# Patient Record
Sex: Male | Born: 1955 | Race: White | Hispanic: No | Marital: Single | Smoking: Never smoker
Health system: Southern US, Community
[De-identification: ages and names within clinical notes are randomized; demographics above are authoritative.]

## PROBLEM LIST (undated history)

## (undated) HISTORY — PX: BACK SURGERY: SHX140

## (undated) HISTORY — PX: LEG SURGERY: SHX1003

---

## 2001-02-25 ENCOUNTER — Emergency Department (HOSPITAL_COMMUNITY): Admission: EM | Admit: 2001-02-25 | Discharge: 2001-02-25 | Payer: Self-pay | Admitting: Emergency Medicine

## 2001-02-25 ENCOUNTER — Encounter: Payer: Self-pay | Admitting: Emergency Medicine

## 2001-07-06 ENCOUNTER — Emergency Department (HOSPITAL_COMMUNITY): Admission: EM | Admit: 2001-07-06 | Discharge: 2001-07-07 | Payer: Self-pay | Admitting: Emergency Medicine

## 2001-07-31 ENCOUNTER — Emergency Department (HOSPITAL_COMMUNITY): Admission: EM | Admit: 2001-07-31 | Discharge: 2001-07-31 | Payer: Self-pay

## 2001-08-22 ENCOUNTER — Emergency Department (HOSPITAL_COMMUNITY): Admission: EM | Admit: 2001-08-22 | Discharge: 2001-08-22 | Payer: Self-pay | Admitting: Emergency Medicine

## 2001-08-23 ENCOUNTER — Emergency Department (HOSPITAL_COMMUNITY): Admission: EM | Admit: 2001-08-23 | Discharge: 2001-08-23 | Payer: Self-pay | Admitting: Emergency Medicine

## 2002-07-08 ENCOUNTER — Emergency Department (HOSPITAL_COMMUNITY): Admission: EM | Admit: 2002-07-08 | Discharge: 2002-07-08 | Payer: Self-pay | Admitting: *Deleted

## 2002-11-21 ENCOUNTER — Encounter: Payer: Self-pay | Admitting: Emergency Medicine

## 2002-11-21 ENCOUNTER — Emergency Department (HOSPITAL_COMMUNITY): Admission: EM | Admit: 2002-11-21 | Discharge: 2002-11-21 | Payer: Self-pay | Admitting: Emergency Medicine

## 2003-01-08 ENCOUNTER — Emergency Department (HOSPITAL_COMMUNITY): Admission: EM | Admit: 2003-01-08 | Discharge: 2003-01-08 | Payer: Self-pay | Admitting: Emergency Medicine

## 2003-01-18 ENCOUNTER — Ambulatory Visit (HOSPITAL_BASED_OUTPATIENT_CLINIC_OR_DEPARTMENT_OTHER): Admission: RE | Admit: 2003-01-18 | Discharge: 2003-01-18 | Payer: Self-pay | Admitting: Orthopedic Surgery

## 2003-02-08 ENCOUNTER — Emergency Department (HOSPITAL_COMMUNITY): Admission: EM | Admit: 2003-02-08 | Discharge: 2003-02-09 | Payer: Self-pay | Admitting: Emergency Medicine

## 2003-02-09 ENCOUNTER — Encounter: Payer: Self-pay | Admitting: Emergency Medicine

## 2003-02-09 ENCOUNTER — Ambulatory Visit (HOSPITAL_COMMUNITY): Admission: RE | Admit: 2003-02-09 | Discharge: 2003-02-09 | Payer: Self-pay | Admitting: Orthopedic Surgery

## 2003-02-10 ENCOUNTER — Ambulatory Visit (HOSPITAL_COMMUNITY): Admission: RE | Admit: 2003-02-10 | Discharge: 2003-02-15 | Payer: Self-pay | Admitting: Orthopedic Surgery

## 2003-05-04 ENCOUNTER — Emergency Department (HOSPITAL_COMMUNITY): Admission: EM | Admit: 2003-05-04 | Discharge: 2003-05-04 | Payer: Self-pay | Admitting: Emergency Medicine

## 2003-05-05 ENCOUNTER — Emergency Department (HOSPITAL_COMMUNITY): Admission: EM | Admit: 2003-05-05 | Discharge: 2003-05-05 | Payer: Self-pay | Admitting: Emergency Medicine

## 2003-05-06 ENCOUNTER — Emergency Department (HOSPITAL_COMMUNITY): Admission: EM | Admit: 2003-05-06 | Discharge: 2003-05-06 | Payer: Self-pay | Admitting: Emergency Medicine

## 2003-05-07 ENCOUNTER — Emergency Department (HOSPITAL_COMMUNITY): Admission: EM | Admit: 2003-05-07 | Discharge: 2003-05-07 | Payer: Self-pay

## 2003-07-11 ENCOUNTER — Emergency Department (HOSPITAL_COMMUNITY): Admission: AD | Admit: 2003-07-11 | Discharge: 2003-07-11 | Payer: Self-pay | Admitting: Internal Medicine

## 2003-07-13 ENCOUNTER — Emergency Department (HOSPITAL_COMMUNITY): Admission: AD | Admit: 2003-07-13 | Discharge: 2003-07-13 | Payer: Self-pay | Admitting: Family Medicine

## 2003-07-16 ENCOUNTER — Emergency Department (HOSPITAL_COMMUNITY): Admission: AD | Admit: 2003-07-16 | Discharge: 2003-07-16 | Payer: Self-pay | Admitting: Family Medicine

## 2003-07-19 ENCOUNTER — Emergency Department (HOSPITAL_COMMUNITY): Admission: AD | Admit: 2003-07-19 | Discharge: 2003-07-19 | Payer: Self-pay | Admitting: Family Medicine

## 2004-05-27 ENCOUNTER — Emergency Department (HOSPITAL_COMMUNITY): Admission: EM | Admit: 2004-05-27 | Discharge: 2004-05-27 | Payer: Self-pay | Admitting: Emergency Medicine

## 2004-05-31 ENCOUNTER — Emergency Department (HOSPITAL_COMMUNITY): Admission: EM | Admit: 2004-05-31 | Discharge: 2004-05-31 | Payer: Self-pay | Admitting: Emergency Medicine

## 2004-06-07 ENCOUNTER — Emergency Department (HOSPITAL_COMMUNITY): Admission: EM | Admit: 2004-06-07 | Discharge: 2004-06-07 | Payer: Self-pay | Admitting: Emergency Medicine

## 2004-06-10 ENCOUNTER — Emergency Department (HOSPITAL_COMMUNITY): Admission: EM | Admit: 2004-06-10 | Discharge: 2004-06-11 | Payer: Self-pay | Admitting: Emergency Medicine

## 2004-06-10 ENCOUNTER — Ambulatory Visit (HOSPITAL_COMMUNITY): Admission: RE | Admit: 2004-06-10 | Discharge: 2004-06-10 | Payer: Self-pay | Admitting: Emergency Medicine

## 2004-06-22 ENCOUNTER — Ambulatory Visit (HOSPITAL_COMMUNITY): Admission: RE | Admit: 2004-06-22 | Discharge: 2004-06-23 | Payer: Self-pay | Admitting: Neurosurgery

## 2004-08-14 ENCOUNTER — Ambulatory Visit (HOSPITAL_COMMUNITY): Admission: RE | Admit: 2004-08-14 | Discharge: 2004-08-14 | Payer: Self-pay | Admitting: Neurosurgery

## 2005-10-04 ENCOUNTER — Emergency Department (HOSPITAL_COMMUNITY): Admission: EM | Admit: 2005-10-04 | Discharge: 2005-10-04 | Payer: Self-pay | Admitting: Family Medicine

## 2005-10-06 ENCOUNTER — Emergency Department (HOSPITAL_COMMUNITY): Admission: EM | Admit: 2005-10-06 | Discharge: 2005-10-06 | Payer: Self-pay | Admitting: Family Medicine

## 2006-01-01 ENCOUNTER — Emergency Department (HOSPITAL_COMMUNITY): Admission: EM | Admit: 2006-01-01 | Discharge: 2006-01-01 | Payer: Self-pay | Admitting: Family Medicine

## 2006-01-03 ENCOUNTER — Emergency Department (HOSPITAL_COMMUNITY): Admission: EM | Admit: 2006-01-03 | Discharge: 2006-01-03 | Payer: Self-pay | Admitting: Family Medicine

## 2006-01-09 ENCOUNTER — Emergency Department (HOSPITAL_COMMUNITY): Admission: EM | Admit: 2006-01-09 | Discharge: 2006-01-09 | Payer: Self-pay | Admitting: Family Medicine

## 2006-03-05 ENCOUNTER — Emergency Department (HOSPITAL_COMMUNITY): Admission: EM | Admit: 2006-03-05 | Discharge: 2006-03-05 | Payer: Self-pay | Admitting: Family Medicine

## 2006-03-07 ENCOUNTER — Emergency Department (HOSPITAL_COMMUNITY): Admission: EM | Admit: 2006-03-07 | Discharge: 2006-03-07 | Payer: Self-pay | Admitting: Family Medicine

## 2006-04-10 ENCOUNTER — Emergency Department (HOSPITAL_COMMUNITY): Admission: EM | Admit: 2006-04-10 | Discharge: 2006-04-10 | Payer: Self-pay | Admitting: Family Medicine

## 2006-04-12 ENCOUNTER — Emergency Department (HOSPITAL_COMMUNITY): Admission: EM | Admit: 2006-04-12 | Discharge: 2006-04-12 | Payer: Self-pay | Admitting: Emergency Medicine

## 2006-05-28 ENCOUNTER — Emergency Department (HOSPITAL_COMMUNITY): Admission: EM | Admit: 2006-05-28 | Discharge: 2006-05-28 | Payer: Self-pay | Admitting: Emergency Medicine

## 2006-07-14 ENCOUNTER — Emergency Department (HOSPITAL_COMMUNITY): Admission: EM | Admit: 2006-07-14 | Discharge: 2006-07-14 | Payer: Self-pay | Admitting: Family Medicine

## 2006-07-16 ENCOUNTER — Emergency Department (HOSPITAL_COMMUNITY): Admission: EM | Admit: 2006-07-16 | Discharge: 2006-07-16 | Payer: Self-pay | Admitting: Family Medicine

## 2006-07-18 ENCOUNTER — Emergency Department (HOSPITAL_COMMUNITY): Admission: EM | Admit: 2006-07-18 | Discharge: 2006-07-18 | Payer: Self-pay | Admitting: Family Medicine

## 2006-07-21 ENCOUNTER — Emergency Department (HOSPITAL_COMMUNITY): Admission: EM | Admit: 2006-07-21 | Discharge: 2006-07-21 | Payer: Self-pay | Admitting: Family Medicine

## 2006-11-08 ENCOUNTER — Emergency Department (HOSPITAL_COMMUNITY): Admission: EM | Admit: 2006-11-08 | Discharge: 2006-11-09 | Payer: Self-pay | Admitting: Emergency Medicine

## 2006-12-11 ENCOUNTER — Emergency Department (HOSPITAL_COMMUNITY): Admission: EM | Admit: 2006-12-11 | Discharge: 2006-12-11 | Payer: Self-pay | Admitting: Emergency Medicine

## 2007-02-03 ENCOUNTER — Emergency Department (HOSPITAL_COMMUNITY): Admission: EM | Admit: 2007-02-03 | Discharge: 2007-02-03 | Payer: Self-pay | Admitting: Family Medicine

## 2007-02-05 ENCOUNTER — Emergency Department (HOSPITAL_COMMUNITY): Admission: EM | Admit: 2007-02-05 | Discharge: 2007-02-05 | Payer: Self-pay | Admitting: Family Medicine

## 2007-09-03 ENCOUNTER — Emergency Department (HOSPITAL_COMMUNITY): Admission: EM | Admit: 2007-09-03 | Discharge: 2007-09-03 | Payer: Self-pay | Admitting: Emergency Medicine

## 2008-12-10 ENCOUNTER — Emergency Department (HOSPITAL_COMMUNITY): Admission: EM | Admit: 2008-12-10 | Discharge: 2008-12-10 | Payer: Self-pay | Admitting: Emergency Medicine

## 2009-12-19 ENCOUNTER — Emergency Department (HOSPITAL_COMMUNITY): Admission: EM | Admit: 2009-12-19 | Discharge: 2009-12-19 | Payer: Self-pay | Admitting: Family Medicine

## 2010-09-09 ENCOUNTER — Emergency Department (HOSPITAL_COMMUNITY)
Admission: EM | Admit: 2010-09-09 | Discharge: 2010-09-09 | Payer: Self-pay | Source: Home / Self Care | Admitting: Emergency Medicine

## 2011-02-02 NOTE — Op Note (Signed)
   NAME:  Larry Shaw, Larry Shaw                         ACCOUNT NO.:  [id number]   MEDICAL RECORD NO.:  [id number]                   PATIENT TYPE:  AMB   LOCATION:  DSC                                  FACILITY:  [facility]   PHYSICIAN:  Almedia Balls. Ranell Patrick, M.D.              DATE OF BIRTH:  01/23/1956   DATE OF PROCEDURE:  01/18/2003  DATE OF DISCHARGE:                                 OPERATIVE REPORT   PREOPERATIVE DIAGNOSIS:  Right fifth metatarsal fracture.   POSTOPERATIVE DIAGNOSIS:  Right fifth metatarsal fracture.   OPERATION PERFORMED:  Percutaneous cannulated screw fixation, right fifth  metatarsal (Jones fracture).   SURGEON:  Almedia Balls. Ranell Patrick, M.D.   ASSISTANT:  Druscilla Brownie. Cherlynn June.   ANESTHESIA:  General.   ESTIMATED BLOOD LOSS:  Minimal.   FLUIDS REPLACED:  crystalloid.   INSTRUMENT COUNT:  Correct.   COMPLICATIONS:  None.   ANTIBIOTICS:  Preoperative antibiotics given.   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  The patient has had a series of radiographs taken at [facility] demonstrating progressive stress fracture developing in the Jones  region of the fifth metatarsal base.  The patient presents with disabling  pain and swelling and desiring operative fixation.  The risks and benefits  of both surgical and nonsurgical treatment were discussed.  The patient  elected to proceed with surgery.   DESCRIPTION OF PROCEDURE:  After an adequate level of anesthesia was  achieved, the patient was positioned supine on the operating table.  A bump  was placed under the right hip after sterile prep and drape and utilizing C-  arm fluoroscopy, a single percutaneous 4.5 cannulated screw was placed  proximal to distal across the patient's fracture sites.  This gained  excellent purchase in the distal fragment and the fracture site was  compressed  noticeably.  A single washer was utilized as well in order to  gain good compression of the screw head.  At this point the wound  was thoroughly irrigated and closed utilizing nylon suture.  Sterile  dressing was placed followed by a short leg splint.  The patient was taken  to the recovery room in stable condition.  He will be non weightbearing for  the next four weeks.                                                Almedia Balls. Ranell Patrick, M.D.    SRN/MEDQ  D:  01/18/2003  T:  01/18/2003  Job:  [id number]

## 2011-02-02 NOTE — Op Note (Signed)
Larry Shaw, Larry Shaw               ACCOUNT NO.:  [id number]   MEDICAL RECORD NO.:  [id number]          PATIENT TYPE:  OIB   LOCATION:  2876                         FACILITY:  [facility]   PHYSICIAN:  Clydene Fake, M.D.  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   PROCEDURE:  Right L4-5 semihemilaminectomy and diskectomy with  microdissection with microscope.   SURGEON:  Clydene Fake, M.D.   ASSISTANT:  Danae Orleans. Venetia Maxon, M.D.   ANESTHESIA:  General endotracheal tube anesthesia.   ESTIMATED BLOOD LOSS:  Minimal.   BLOOD REPLACED:  None.   DRAINS:  None.   COMPLICATIONS:  None.   INDICATIONS FOR PROCEDURE:  The patient is a 55 year old gentleman who is  complaining of pain, and had an acute episode of pain in the right leg with  numbness which has been continuing.  MRI was done showing extremely large  disk herniation with free fragment compressing nerve roots on the right and  causing canal stenosis and the examination shows right DHL's 4 to 5 strength  and dorsiflexion 4+/5 strength.  Positive straight leg raising.  He cannot  ambulate on heels on the right side with decreased sensation in the right 4  and 5 distributions.  The patient is brought in for decompression.   DESCRIPTION OF PROCEDURE:  The patient was brought into the operating room  and general anesthesia was induced.  The patient was placed in a prone  position on a Wilson frame with all pressure points padded.  The patient was  prepped and draped in the usual sterile fashion.  The site of incision was  injected with 10 mL of 1% lidocaine with epinephrine.  Needle was placed in  the interspace.  X-ray was obtained confirming our position at the L4-5  level.  Incision was then made centered where the needle was. The incision  was taken down to the fascia and hemostasis was obtained with Bovie  cauterization.  The fascia was incised on the right side of the spine.  Subperiosteal dissection was done over the L4 and L5 spinous processes up to  the lamina and to facet.  Self-retaining retractor was placed and marker was  placed in the interspace.  Another x-ray was obtained confirming our  position at L4-5.  Semihemilaminectomy was then performed with high speed  drill, pituitary rongeurs, the top of the L5 lamina was removed along with a  __________  over the 5 root.  Microscope was brought in for microdissection.  We dissected down on the epidural space removing more ligament in that disk  space.  Nerve root was tight and pushed laterally.  We could dissect under  it a little bit and found free fragment of disk.  We were able to remove a  few pieces, but could not get to the big fragment.  At this point  we incised  the disk space with a 15 blade and diskectomy was performed.  Pituitary  rongeurs and curets, we pushed some free fragment back into the disk space  and removed some and then we were able to get a piece of towel free fragment  of disk and slowing removing that, we were able to remove a huge fragment  that was out of the disk space up under the dura and nerve roots.  After  this, we were able to retract the nerve root and dura medially and we  continued decompressing and removed some more smaller fragments of disk and  continuing with the diskectomy.  Diskectomy was completed with thick curets  and pituitary rongeurs.  Osteophytic edges were removed with osteophyte  tool.  Hemostasis was obtained with thrombin and bipolar cauterization.  Gelfoam was irrigated out.  When we were finished, we had good central and  lateral decompression of both the 4 and 5 roots were decompressed.  We had  good hemostasis.  The wound was irrigated  with antibiotic solution.  This  was suctioned out and the retractor was removed and the fascia closed with 0  Vicryl interrupted suture, the subcutaneous tissue closed with 0, 2-0, and 3-  0 Vicryl interrupted suture, and the skin closed with Benzoin and Steri-  Strips.  Dressing was placed.  The patient was placed back in the supine  position, awakened from anesthesia, and transferred to the recovery room in  stable condition.       JRH/MEDQ  D:  06/22/2004  T:  06/22/2004  Job:  [id number]

## 2011-02-02 NOTE — Op Note (Signed)
NAME:  Larry Shaw, Larry Shaw                         ACCOUNT NO.:  [id number]   MEDICAL RECORD NO.:  [id number]                   PATIENT TYPE:  OIB   LOCATION:  2888                                 FACILITY:  [facility]   PHYSICIAN:  Almedia Balls. Ranell Patrick, M.D.              DATE OF BIRTH:  Aug 27, 1956   DATE OF PROCEDURE:  02/10/2003  DATE OF DISCHARGE:                                 OPERATIVE REPORT   PREOPERATIVE DIAGNOSIS:  Right foot infection.   POSTOPERATIVE DIAGNOSIS:  Right foot infection.   OPERATION PERFORMED:  Hardware removal and irrigation and debridement of  right foot infection, fifth metatarsal fracture.   SURGEON:  Almedia Balls. Ranell Patrick, M.D.   ASSISTANT:  None.   ANESTHESIA:  Laryngeal mask.   ESTIMATED BLOOD LOSS:  Minimal.   FLUIDS REPLACED:  crystalloid.   INSTRUMENT COUNT:  Correct.   COMPLICATIONS:  None.   CULTURES:  Preoperative cultures were sent.   INDICATIONS FOR PROCEDURE:  The patient is a 55 year old male who is about a  month status post open reduction internal fixation of a stress fracture of  his right fifth metatarsal.  The patient had a cannulated screw fixation  performed at [facility] back on Jan 18, 2003.  The patient  initially did well after surgery saying his pain was improved.  He has been  minimal weightbearing on the foot.  Presented over the last two days  complaining of increasing pain and swelling in the foot.  Radiographs  demonstrate some loosening of the screw head indicative of an localized  infection __________ fifth metatarsal with the screw.  Due to concerns about  the swelling and infection in his foot, it was recommended to him to proceed  with hardware removal and I&D. The patient consented to surgery.   DESCRIPTION OF PROCEDURE:  After an adequate level of anesthesia was  achieved, the patient was positioned supine on the operating table.  The  patient's prior stab incision was opened up utilizing a 15  blade scalpel.  Dissection was carried bluntly down through subcutaneous tissues using  hemostats.  No gross purulence was noted. The subcutaneous tissues were  cultured utilizing culture swabs for aerobic, anaerobic, stat Gram stain.  Once the hardware was out, deep cultures were obtained including a little  bit of tissue.  This again was sent for aerobic, anaerobic, Gram stain.  With both the 4.5 cannulated screw out and the washer out, a thorough  debridement was performed of the base of the fifth metatarsal including  curetting the entrance hole for the screw also overdrilling the path of the  screw with the 4.5 drill bit off the 4.5 cannulated drill set and irrigation  of the intramedullary canal was performed utilizing a Frazier tip suction  and blasting antibiotic and normal saline irrigation through that.  Again,  this appeared to be a fairly benign wound with no  evidence of gross  purulence and no soft bone encountered with a curet.  The wound again was  irrigated on the way out and the wound loosely sutured utilizing a single 3-  0 nylon suture so that it could drain.  A sterile dressing followed by a  short leg splint was applied.  The patient tolerated the procedure well and  was taken to the recovery room in stable condition.                                                Almedia Balls. Ranell Patrick, M.D.    SRN/MEDQ  D:  02/10/2003  T:  02/10/2003  Job:  [id number]

## 2011-06-22 LAB — URINALYSIS, ROUTINE W REFLEX MICROSCOPIC
Bilirubin Urine: NEGATIVE
Glucose, UA: NEGATIVE
Hgb urine dipstick: NEGATIVE
Ketones, ur: NEGATIVE
Nitrite: NEGATIVE
Protein, ur: NEGATIVE
Specific Gravity, Urine: 1.022
Urobilinogen, UA: 0.2
pH: 6

## 2012-02-11 ENCOUNTER — Emergency Department (INDEPENDENT_AMBULATORY_CARE_PROVIDER_SITE_OTHER)
Admission: EM | Admit: 2012-02-11 | Discharge: 2012-02-11 | Disposition: A | Discharge status: Nursing Home (Includes ICF) | Payer: Self-pay | Source: Home / Self Care | Attending: Emergency Medicine | Admitting: Emergency Medicine

## 2012-02-11 ENCOUNTER — Emergency Department (HOSPITAL_COMMUNITY): Payer: Self-pay

## 2012-02-11 ENCOUNTER — Encounter (HOSPITAL_COMMUNITY): Payer: Self-pay | Admitting: *Deleted

## 2012-02-11 ENCOUNTER — Observation Stay (HOSPITAL_COMMUNITY): Payer: Self-pay

## 2012-02-11 ENCOUNTER — Observation Stay (HOSPITAL_COMMUNITY)
Admission: EM | Admit: 2012-02-11 | Discharge: 2012-02-11 | Disposition: A | Discharge status: Home / Self Care | Payer: Self-pay | Attending: Emergency Medicine | Admitting: Emergency Medicine

## 2012-02-11 ENCOUNTER — Encounter (HOSPITAL_COMMUNITY): Payer: Self-pay | Admitting: Emergency Medicine

## 2012-02-11 DIAGNOSIS — E860 Dehydration: Secondary | ICD-10-CM

## 2012-02-11 DIAGNOSIS — R0789 Other chest pain: Secondary | ICD-10-CM

## 2012-02-11 DIAGNOSIS — R319 Hematuria, unspecified: Secondary | ICD-10-CM | POA: Insufficient documentation

## 2012-02-11 DIAGNOSIS — R109 Unspecified abdominal pain: Principal | ICD-10-CM | POA: Insufficient documentation

## 2012-02-11 DIAGNOSIS — R079 Chest pain, unspecified: Secondary | ICD-10-CM

## 2012-02-11 DIAGNOSIS — K5900 Constipation, unspecified: Secondary | ICD-10-CM | POA: Insufficient documentation

## 2012-02-11 LAB — BASIC METABOLIC PANEL
BUN: 19 mg/dL (ref 6–23)
CO2: 25 mEq/L (ref 19–32)
Calcium: 9.9 mg/dL (ref 8.4–10.5)
Chloride: 101 mEq/L (ref 96–112)
Creatinine, Ser: 1.29 mg/dL (ref 0.50–1.35)
GFR calc Af Amer: 71 mL/min — ABNORMAL LOW (ref >90)
GFR calc non Af Amer: 61 mL/min — ABNORMAL LOW (ref >90)
Glucose, Bld: 97 mg/dL (ref 70–99)
Potassium: 3.8 mEq/L (ref 3.5–5.1)
Sodium: 137 mEq/L (ref 135–145)

## 2012-02-11 LAB — HEPATIC FUNCTION PANEL
ALT: 18 U/L (ref 0–53)
AST: 17 U/L (ref 0–37)
Albumin: 4 g/dL (ref 3.5–5.2)
Alkaline Phosphatase: 89 U/L (ref 39–117)
Bilirubin, Direct: <0.1 mg/dL (ref 0.0–0.3)
Total Bilirubin: 0.3 mg/dL (ref 0.3–1.2)
Total Protein: 7.3 g/dL (ref 6.0–8.3)

## 2012-02-11 LAB — POCT I-STAT TROPONIN I: Troponin i, poc: 0 ng/mL (ref 0.00–0.08)

## 2012-02-11 LAB — DIFFERENTIAL
Basophils Absolute: 0 10*3/uL (ref 0.0–0.1)
Basophils Relative: 0 % (ref 0–1)
Eosinophils Absolute: 0.4 10*3/uL (ref 0.0–0.7)
Eosinophils Relative: 5 % (ref 0–5)
Lymphocytes Relative: 24 % (ref 12–46)
Lymphs Abs: 2.1 10*3/uL (ref 0.7–4.0)
Monocytes Absolute: 0.8 10*3/uL (ref 0.1–1.0)
Monocytes Relative: 10 % (ref 3–12)
Neutro Abs: 5.2 10*3/uL (ref 1.7–7.7)
Neutrophils Relative %: 61 % (ref 43–77)

## 2012-02-11 LAB — CBC
HCT: 46.1 % (ref 39.0–52.0)
Hemoglobin: 15.9 g/dL (ref 13.0–17.0)
MCH: 30.2 pg (ref 26.0–34.0)
MCHC: 34.5 g/dL (ref 30.0–36.0)
MCV: 87.5 fL (ref 78.0–100.0)
Platelets: 331 10*3/uL (ref 150–400)
RBC: 5.27 MIL/uL (ref 4.22–5.81)
RDW: 13.1 % (ref 11.5–15.5)
WBC: 8.6 10*3/uL (ref 4.0–10.5)

## 2012-02-11 LAB — URINALYSIS, ROUTINE W REFLEX MICROSCOPIC
Glucose, UA: NEGATIVE mg/dL
Hgb urine dipstick: NEGATIVE
Ketones, ur: 15 mg/dL — AB
Leukocytes, UA: NEGATIVE
Nitrite: NEGATIVE
Protein, ur: 100 mg/dL — AB
Specific Gravity, Urine: 1.036 — ABNORMAL HIGH (ref 1.005–1.030)
Urobilinogen, UA: 1 mg/dL (ref 0.0–1.0)
pH: 5.5 (ref 5.0–8.0)

## 2012-02-11 LAB — URINE MICROSCOPIC-ADD ON

## 2012-02-11 MED ORDER — KETOROLAC TROMETHAMINE 30 MG/ML IJ SOLN
INTRAMUSCULAR | Status: AC
  Filled 2012-02-11: qty 1

## 2012-02-11 MED ORDER — SODIUM CHLORIDE 0.9 % IV BOLUS (SEPSIS)
1000.0000 mL | Freq: Once | INTRAVENOUS | Status: AC
  Administered 2012-02-11: 1000 mL via INTRAVENOUS

## 2012-02-11 MED ORDER — KETOROLAC TROMETHAMINE 60 MG/2ML IM SOLN
60.0000 mg | Freq: Once | INTRAMUSCULAR | Status: AC
  Administered 2012-02-11: 60 mg via INTRAMUSCULAR

## 2012-02-11 MED ORDER — IBUPROFEN 800 MG PO TABS: 800.0000 mg | ORAL_TABLET | Freq: Three times a day (TID) | ORAL | Status: AC

## 2012-02-11 NOTE — ED Provider Notes (Signed)
Received patient care at shift change. 56 year old male presents with right flank pain radiates to right hemithorax. Has been seen and evaluated. Plan is to rehydrate [place] in the CDU, we'll check repeat troponin and will obtain a d-dimer.  5:14 PM Pt sts he feels better after treatment.  No longer tachycardic.  Repeat troponin and d-dimer unremarkable.  Pain likely musculoskeletal.  Stable to be discharge.  I encourage hydration at home.  Pt tolerates PO.  VSS.    Results for orders placed during the hospital encounter of 02/11/12  CBC      Component Value Range   WBC 8.6  4.0 - 10.5 (K/uL)   RBC 5.27  4.22 - 5.81 (MIL/uL)   Hemoglobin 15.9  13.0 - 17.0 (g/dL)   HCT 16.1  09.6 - 04.5 (%)   MCV 87.5  78.0 - 100.0 (fL)   MCH 30.2  26.0 - 34.0 (pg)   MCHC 34.5  30.0 - 36.0 (g/dL)   RDW 40.9  81.1 - 91.4 (%)   Platelets 331  150 - 400 (K/uL)  DIFFERENTIAL      Component Value Range   Neutrophils Relative 61  43 - 77 (%)   Neutro Abs 5.2  1.7 - 7.7 (K/uL)   Lymphocytes Relative 24  12 - 46 (%)   Lymphs Abs 2.1  0.7 - 4.0 (K/uL)   Monocytes Relative 10  3 - 12 (%)   Monocytes Absolute 0.8  0.1 - 1.0 (K/uL)   Eosinophils Relative 5  0 - 5 (%)   Eosinophils Absolute 0.4  0.0 - 0.7 (K/uL)   Basophils Relative 0  0 - 1 (%)   Basophils Absolute 0.0  0.0 - 0.1 (K/uL)  BASIC METABOLIC PANEL      Component Value Range   Sodium 137  135 - 145 (mEq/L)   Potassium 3.8  3.5 - 5.1 (mEq/L)   Chloride 101  96 - 112 (mEq/L)   CO2 25  19 - 32 (mEq/L)   Glucose, Bld 97  70 - 99 (mg/dL)   BUN 19  6 - 23 (mg/dL)   Creatinine, Ser 7.82  0.50 - 1.35 (mg/dL)   Calcium 9.9  8.4 - 95.6 (mg/dL)   GFR calc non Af Amer 61 (*) >90 (mL/min)   GFR calc Af Amer 71 (*) >90 (mL/min)  URINALYSIS, ROUTINE W REFLEX MICROSCOPIC      Component Value Range   Color, Urine AMBER (*) YELLOW    APPearance CLOUDY (*) CLEAR    Specific Gravity, Urine 1.036 (*) 1.005 - 1.030    pH 5.5  5.0 - 8.0    Glucose, UA NEGATIVE   NEGATIVE (mg/dL)   Hgb urine dipstick NEGATIVE  NEGATIVE    Bilirubin Urine SMALL (*) NEGATIVE    Ketones, ur 15 (*) NEGATIVE (mg/dL)   Protein, ur 213 (*) NEGATIVE (mg/dL)   Urobilinogen, UA 1.0  0.0 - 1.0 (mg/dL)   Nitrite NEGATIVE  NEGATIVE    Leukocytes, UA NEGATIVE  NEGATIVE   POCT I-STAT TROPONIN I      Component Value Range   Troponin i, poc 0.00  0.00 - 0.08 (ng/mL)   Comment 3           HEPATIC FUNCTION PANEL      Component Value Range   Total Protein 7.3  6.0 - 8.3 (g/dL)   Albumin 4.0  3.5 - 5.2 (g/dL)   AST 17  0 - 37 (U/L)   ALT 18  0 -  53 (U/L)   Alkaline Phosphatase 89  39 - 117 (U/L)   Total Bilirubin 0.3  0.3 - 1.2 (mg/dL)   Bilirubin, Direct <1.6  0.0 - 0.3 (mg/dL)   Indirect Bilirubin NOT CALCULATED  0.3 - 0.9 (mg/dL)  URINE MICROSCOPIC-ADD ON      Component Value Range   Squamous Epithelial / LPF RARE  RARE    WBC, UA 0-2  <3 (WBC/hpf)   RBC / HPF 0-2  <3 (RBC/hpf)   Bacteria, UA RARE  RARE    Urine-Other MUCOUS PRESENT    D-DIMER, QUANTITATIVE      Component Value Range   D-Dimer, Quant <0.22  0.00 - 0.48 (ug/mL-FEU)  TROPONIN I      Component Value Range   Troponin I <0.30  <0.30 (ng/mL)   Ct Abdomen Pelvis Wo Contrast  02/11/2012  *RADIOLOGY REPORT*  Clinical Data: Left flank pain, hematuria  CT ABDOMEN AND PELVIS WITHOUT CONTRAST  Technique:  Multidetector CT imaging of the abdomen and pelvis was performed following the standard protocol without intravenous contrast.  Comparison: None.  Findings: Lung bases are essentially clear.  Unenhanced spleen, pancreas, and adrenal glands are within normal limits.  Gallbladder is unremarkable.  No intrahepatic or extrahepatic ductal dilatation.  Kidneys are within normal limits.  No renal calculi or hydronephrosis.  No evidence of bowel obstruction.  Normal appendix.  No colonic wall thickening or inflammatory changes.  Atherosclerotic calcifications of the abdominal aorta and branch vessels.  No abdominopelvic  ascites.  No suspicious abdominopelvic lymphadenopathy.  Dystrophic calcifications in the prostate.  No ureteral or bladder calculi.  Degenerative changes of the visualized thoracolumbar spine, most prominent at L4-5.  IMPRESSION: No renal, ureteral, or bladder calculi.  No hydronephrosis.  No CT findings to account for the patient's left flank pain.  Original Report Authenticated By: Charline Bills, M.D.   Dg Chest 2 View  02/11/2012  *RADIOLOGY REPORT*  Clinical Data: Right lateral chest pain  CHEST - 2 VIEW  Comparison: 02/11/2012 at 0941 hours  Findings: Lungs are clear. No pleural effusion or pneumothorax.  Cardiomediastinal silhouette is within normal limits.  Mild degenerative changes of the visualized thoracolumbar spine.  IMPRESSION: No evidence of acute cardiopulmonary disease.  Original Report Authenticated By: Charline Bills, M.D.   Dg Chest 2 View  02/11/2012  *RADIOLOGY REPORT*  Clinical Data: Severe right chest pain since this morning.  CHEST - 2 VIEW  Comparison: None.  Findings: Heart size and vascularity are normal and the lungs are clear.  No osseous abnormality.  IMPRESSION: Normal chest.  Original Report Authenticated By: Gwynn Burly, M.D.      Fayrene Helper, PA-C 02/11/12 [phone]

## 2012-02-11 NOTE — ED Provider Notes (Signed)
Chief Complaint  Patient presents with  . Chest Pain    History of Present Illness:   Larry Shaw is a 56 year old male who experienced sudden onset of severe right hemithorax pain today while at work. He was just walking slowly when the pain came on. The pain seems to be centered around the right axilla but radiates widely to the right upper quadrant of the abdomen, the entire right hemithorax, right upper back, right shoulder, down the right arm and into the hand, and into the neck. The pain is worse with movement and also with deep inspiration. His arm feels numb and tingling but there is no weakness. He denies any fever, chills, or diaphoresis. He's had no shortness of breath or wheezing. He does have a cough productive of some yellow sputum, although this is an ongoing issue. The pain is worse with movement of the neck and the shoulder. He denies any palpitations, dizziness, or syncope. He denies any nausea or vomiting. The pain does involve the right upper quadrant of the abdomen. He has no history of heart disease, pulmonary disease, or GI problems in the past.  Review of Systems:  Other than noted above, the patient denies any of the following symptoms. Systemic:  No fever, chills, sweats, or fatigue. ENT:  No nasal congestion, rhinorrhea, or sore throat. Pulmonary:  No cough, wheezing, shortness of breath, sputum production, hemoptysis. Cardiac:  No palpitations, rapid heartbeat, dizziness, presyncope or syncope. GI:  No abdominal pain, heartburn, nausea, or vomiting. Skin:  No rash or itching. Ext:  No leg pain or swelling.   PMFSH:  Past medical history, family history, social history, meds, and allergies were reviewed and updated as needed.  Physical Exam:   Vital signs:  BP 128/92  Pulse 104  Temp(Src) 98.4 F (36.9 C) (Oral)  Resp 24  SpO2 96% Gen:  Alert, oriented, in no distress, skin warm and dry. Eye:  PERRL, lids and conjunctivas normal.  Sclera non-icteric. ENT:  Mucous  membranes moist, pharynx clear. Neck:  Supple, no adenopathy or tenderness.  No JVD. Lungs:  Clear to auscultation, no wheezes, rales or rhonchi.  No respiratory distress. Heart:  Regular rhythm.  No gallops, murmers, clicks or rubs. Chest:  He has diffuse chest wall tenderness, this seems to be mostly centered in the upper back area on the right just medial to the shoulder blade. Abdomen:  Soft, flat, and nondistended. There was tenderness to palpation in the right upper quadrant and a positive Murphy's sign but negative Murphy's punch.  Bowel sounds normal.  No pulsatile abdominal mass or bruit. Ext:  No edema.  No calf tenderness and Homann's sign negative.  Pulses full and equal. Skin:  Warm and dry.  No rash.  Radiology:  Dg Chest 2 View  02/11/2012  *RADIOLOGY REPORT*  Clinical Data: Severe right chest pain since this morning.  CHEST - 2 VIEW  Comparison: None.  Findings: Heart size and vascularity are normal and the lungs are clear.  No osseous abnormality.  IMPRESSION: Normal chest.  Original Report Authenticated By: Gwynn Burly, M.D.   EKG:   Date: 02/11/2012  Rate: 90  Rhythm: normal sinus rhythm  QRS Axis: normal  Intervals: normal  ST/T Wave abnormalities: normal  Conduction Disutrbances:none  Narrative Interpretation: Normal sinus rhythm, moderate voltage criteria for LVH, maybe normal variant, borderline EKG.  Old EKG Reviewed: none available  Medications given in [facility]:  He was given Toradol 60 mg IM and tolerated this well without any  immediate side effects. His pain level went from a 10 down to an 8.  Assessment:  The encounter diagnosis was Chest pain. The differential is extensive including musculoskeletal pain, cervical radiculopathy, pulmonary embolus, or gallbladder disease. I think he warrants further evaluation, so we'll send to the emergency department for further testing.  Plan:   1.  The following meds were prescribed:   New Prescriptions   No medications  on file   2.  The patient was sent to the emergency department via shuttle.  Reuben Likes, MD 02/11/12 1034

## 2012-02-11 NOTE — Discharge Instructions (Signed)
We have determined that your problem requires further evaluation in the emergency department.  We will take care of your transport there.  Once at the emergency department, you will be evaluated by a provider and they will order whatever treatment or tests they deem necessary.  We cannot guarantee that they will do any specific test or do any specific treatment.    Chest Pain (Nonspecific) It is often hard to give a specific diagnosis for the cause of chest pain. There is always a chance that your pain could be related to something serious, such as a heart attack or a blood clot in the lungs. You need to follow up with your caregiver for further evaluation. CAUSES   Heartburn.   Pneumonia or bronchitis.   Anxiety or stress.   Inflammation around your heart (pericarditis) or lung (pleuritis or pleurisy).   A blood clot in the lung.   A collapsed lung (pneumothorax). It can develop suddenly on its own (spontaneous pneumothorax) or from injury (trauma) to the chest.   Shingles infection (herpes zoster virus).  The chest wall is composed of bones, muscles, and cartilage. Any of these can be the source of the pain.  The bones can be bruised by injury.   The muscles or cartilage can be strained by coughing or overwork.   The cartilage can be affected by inflammation and become sore (costochondritis).  DIAGNOSIS  Lab tests or other studies, such as X-rays, electrocardiography, stress testing, or cardiac imaging, may be needed to find the cause of your pain.  TREATMENT   Treatment depends on what may be causing your chest pain. Treatment may include:   Acid blockers for heartburn.   Anti-inflammatory medicine.   Pain medicine for inflammatory conditions.   Antibiotics if an infection is present.   You may be advised to change lifestyle habits. This includes stopping smoking and avoiding alcohol, caffeine, and chocolate.   You may be advised to keep your head raised (elevated) when  sleeping. This reduces the chance of acid going backward from your stomach into your esophagus.   Most of the time, nonspecific chest pain will improve within 2 to 3 days with rest and mild pain medicine.  HOME CARE INSTRUCTIONS   If antibiotics were prescribed, take your antibiotics as directed. Finish them even if you start to feel better.   For the next few days, avoid physical activities that bring on chest pain. Continue physical activities as directed.   Do not smoke.   Avoid drinking alcohol.   Only take over-the-counter or prescription medicine for pain, discomfort, or fever as directed by your caregiver.   Follow your caregiver's suggestions for further testing if your chest pain does not go away.   Keep any follow-up appointments you made. If you do not go to an appointment, you could develop lasting (chronic) problems with pain. If there is any problem keeping an appointment, you must call to reschedule.  SEEK MEDICAL CARE IF:   You think you are having problems from the medicine you are taking. Read your medicine instructions carefully.   Your chest pain does not go away, even after treatment.   You develop a rash with blisters on your chest.  SEEK IMMEDIATE MEDICAL CARE IF:   You have increased chest pain or pain that spreads to your arm, neck, jaw, back, or abdomen.   You develop shortness of breath, an increasing cough, or you are coughing up blood.   You have severe back or abdominal   pain, feel nauseous, or vomit.   You develop severe weakness, fainting, or chills.   You have a fever.  THIS IS AN EMERGENCY. Do not wait to see if the pain will go away. Get medical help at once. Call your local emergency services (911 in [place] Do not drive yourself to the hospital. MAKE SURE YOU:   Understand these instructions.   Will watch your condition.   Will get help right away if you are not doing well or get worse.  Document Released: 06/13/2005 Document Revised:  08/23/2011 Document Reviewed: 04/08/2008 [employer] Patient Information 2012 [employer]. 

## 2012-02-11 NOTE — Discharge Instructions (Signed)
Chest Wall Pain Chest wall pain is pain in or around the bones and muscles of your chest. It may take up to 6 weeks to get better. It may take longer if you must stay physically active in your work and activities.  CAUSES  Chest wall pain may happen on its own. However, it may be caused by:  A viral illness like the flu.   Injury.   Coughing.   Exercise.   Arthritis.   Fibromyalgia.   Shingles.  HOME CARE INSTRUCTIONS   Avoid overtiring physical activity. Try not to strain or perform activities that cause pain. This includes any activities using your chest or your abdominal and side muscles, especially if heavy weights are used.   Put ice on the sore area.   Put ice in a plastic bag.   Place a towel between your skin and the bag.   Leave the ice on for 15 to 20 minutes per hour while awake for the first 2 days.   Only take over-the-counter or prescription medicines for pain, discomfort, or fever as directed by your caregiver.  SEEK IMMEDIATE MEDICAL CARE IF:   Your pain increases, or you are very uncomfortable.   You have a fever.   Your chest pain becomes worse.   You have new, unexplained symptoms.   You have nausea or vomiting.   You feel sweaty or lightheaded.   You have a cough with phlegm (sputum), or you cough up blood.  MAKE SURE YOU:   Understand these instructions.   Will watch your condition.   Will get help right away if you are not doing well or get worse.  Document Released: 09/03/2005 Document Revised: 08/23/2011 Document Reviewed: 04/30/2011 [facility] Patient Information 2012 [place], [place].  Dehydration, Adult Dehydration means your body does not have as much fluid as it needs. Your kidneys, brain, and heart will not work properly without the right amount of fluids and salt.  HOME CARE  Ask your doctor how to replace body fluid losses (rehydrate).   Drink enough fluids to keep your pee (urine) clear or pale yellow.   Drink small amounts  of fluids often if you feel sick to your stomach (nauseous) or throw up (vomit).   Eat like you normally do.   Avoid:   Foods or drinks high in sugar.   Bubbly (carbonated) drinks.   Juice.   Very hot or cold fluids.   Drinks with caffeine.   Fatty, greasy foods.   Alcohol.   Tobacco.   Eating too much.   Gelatin desserts.   Wash your hands to avoid spreading germs (bacteria, viruses).   Only take medicine as told by your doctor.   Keep all doctor visits as told.  GET HELP RIGHT AWAY IF:   You cannot drink something without throwing up.   You get worse even with treatment.   Your vomit has blood in it or looks greenish.   Your poop (stool) has blood in it or looks black and tarry.   You have not peed in 6 to 8 hours.   You pee a small amount of very dark pee.   You have a fever.   You pass out (faint).   You have belly (abdominal) pain that gets worse or stays in one spot (localizes).   You have a rash, stiff neck, or bad headache.   You get easily annoyed, sleepy, or are hard to wake up.   You feel weak, dizzy, or very thirsty.  MAKE SURE YOU:   Understand these instructions.   Will watch your condition.   Will get help right away if you are not doing well or get worse.  Document Released: 06/30/2009 Document Revised: 08/23/2011 Document Reviewed: 04/23/2011 [facility] Patient Information 2012 [place], [place].

## 2012-02-11 NOTE — ED Notes (Signed)
Patient was sent down from [facility] states he started having chest pain right lateral onset this am. States he was just walking when the pain started.

## 2012-02-11 NOTE — ED Notes (Signed)
Pt here for sudden onset of right axillary sharp,achy pain radiating to R ARM,FLANK that started @ 7am this morning while at work.c/o pain with any movement and deep breathing.pt has hx asthma but denies wheezing or sob.VSS.ALSO REPORTS OF NUMBNESS IN R ARM

## 2012-02-11 NOTE — ED Provider Notes (Signed)
History     CSN: [id number]  Arrival date & time 02/11/12  1037   First MD Initiated Contact with Patient 02/11/12 1126      Chief Complaint  Patient presents with  . Flank Pain    (Consider location/radiation/quality/duration/timing/severity/associated sxs/prior treatment) Patient is a 56 y.o. male presenting with flank pain. The history is provided by the patient. No language interpreter was used.  Flank Pain This is a new problem. The current episode started today. The problem occurs constantly. The problem has been unchanged. Associated symptoms include chest pain. Pertinent negatives include no abdominal pain, chills, congestion, coughing, diaphoresis, fever, headaches, nausea, neck pain, numbness, vomiting or weakness. The symptoms are aggravated by twisting and bending. He has tried nothing for the symptoms.   56 year old male coming in from urgent care today with complaint of right chest pain that started this morning upon at his job. The pain is worse with palpitation. Patient went to urgent care and received Toradol 60 IM with some relief. Patient denies shortness of breath cough fever calf pain. No past medical history of CAD. The pain is pinpoint it to around the fifth lateral right rib. Dr. Penni Bombard reports that the pain radiated into the chest earlier. The pain is not radiating presently appears to be musculoskeletal. No PCP. Past medical history of asthma. Patient appears dehydrated. Says that he is frequently constipated and he has a tight belly. At that he has to strain to have a bowel movement. No acute distress at this time.    Past Medical History  Diagnosis Date  . Asthma     Past Surgical History  Procedure Date  . Leg surgery     No family history on file.  History  Substance Use Topics  . Smoking status: Never Smoker   . Smokeless tobacco: Not on file  . Alcohol Use: No      Review of Systems  Constitutional: Negative.  Negative for fever, chills and  diaphoresis.  HENT: Negative.  Negative for congestion and neck pain.   Eyes: Negative.   Respiratory: Negative.  Negative for cough and wheezing.   Cardiovascular: Positive for chest pain.  Gastrointestinal: Positive for constipation. Negative for nausea, vomiting, abdominal pain, diarrhea and blood in stool.  Genitourinary: Positive for flank pain. Negative for dysuria, hematuria and testicular pain.  Musculoskeletal: Negative.   Neurological: Negative.  Negative for dizziness, weakness, numbness and headaches.  Psychiatric/Behavioral: Negative.   All other systems reviewed and are negative.    Allergies  Penicillins  Home Medications  No current outpatient prescriptions on file.  BP 114/86  Pulse 92  Temp(Src) 97.4 F (36.3 C) (Oral)  Resp 16  SpO2 98%  Physical Exam  Nursing note and vitals reviewed. Constitutional: He is oriented to person, place, and time. He appears well-developed and well-nourished.  HENT:  Head: Normocephalic.  Eyes: Conjunctivae and EOM are normal. Pupils are equal, round, and reactive to light.  Neck: Normal range of motion. Neck supple.  Cardiovascular: Normal rate.   Pulmonary/Chest: Effort normal and breath sounds normal. No respiratory distress. He has no wheezes.  Abdominal: Soft. Bowel sounds are normal. He exhibits distension. There is no tenderness. There is no rebound and no guarding.       T lateral chest wall pain  Musculoskeletal: Normal range of motion.  Neurological: He is alert and oriented to person, place, and time.  Skin: Skin is warm and dry.  Psychiatric: He has a normal mood and affect.  ED Course  Procedures (including critical care time)  Labs Reviewed  BASIC METABOLIC PANEL - Abnormal; Notable for the following:    GFR calc non Af Amer 61 (*)    GFR calc Af Amer 71 (*)    All other components within normal limits  URINALYSIS, ROUTINE W REFLEX MICROSCOPIC - Abnormal; Notable for the following:    Color, Urine  AMBER (*) BIOCHEMICALS MAY BE AFFECTED BY COLOR   APPearance CLOUDY (*)    Specific Gravity, Urine 1.036 (*)    Bilirubin Urine SMALL (*)    Ketones, ur 15 (*)    Protein, ur 100 (*)    All other components within normal limits  CBC  DIFFERENTIAL  POCT I-STAT TROPONIN I  HEPATIC FUNCTION PANEL  URINE MICROSCOPIC-ADD ON   Ct Abdomen Pelvis Wo Contrast  02/11/2012  *RADIOLOGY REPORT*  Clinical Data: Left flank pain, hematuria  CT ABDOMEN AND PELVIS WITHOUT CONTRAST  Technique:  Multidetector CT imaging of the abdomen and pelvis was performed following the standard protocol without intravenous contrast.  Comparison: None.  Findings: Lung bases are essentially clear.  Unenhanced spleen, pancreas, and adrenal glands are within normal limits.  Gallbladder is unremarkable.  No intrahepatic or extrahepatic ductal dilatation.  Kidneys are within normal limits.  No renal calculi or hydronephrosis.  No evidence of bowel obstruction.  Normal appendix.  No colonic wall thickening or inflammatory changes.  Atherosclerotic calcifications of the abdominal aorta and branch vessels.  No abdominopelvic ascites.  No suspicious abdominopelvic lymphadenopathy.  Dystrophic calcifications in the prostate.  No ureteral or bladder calculi.  Degenerative changes of the visualized thoracolumbar spine, most prominent at L4-5.  IMPRESSION: No renal, ureteral, or bladder calculi.  No hydronephrosis.  No CT findings to account for the patient's left flank pain.  Original Report Authenticated By: Charline Bills, M.D.   Dg Chest 2 View  02/11/2012  *RADIOLOGY REPORT*  Clinical Data: Right lateral chest pain  CHEST - 2 VIEW  Comparison: 02/11/2012 at 0941 hours  Findings: Lungs are clear. No pleural effusion or pneumothorax.  Cardiomediastinal silhouette is within normal limits.  Mild degenerative changes of the visualized thoracolumbar spine.  IMPRESSION: No evidence of acute cardiopulmonary disease.  Original Report Authenticated  By: Charline Bills, M.D.   Dg Chest 2 View  02/11/2012  *RADIOLOGY REPORT*  Clinical Data: Severe right chest pain since this morning.  CHEST - 2 VIEW  Comparison: None.  Findings: Heart size and vascularity are normal and the lungs are clear.  No osseous abnormality.  IMPRESSION: Normal chest.  Original Report Authenticated By: Gwynn Burly, M.D.     No diagnosis found.    MDM   Date: 02/11/2012  Rate: 64      Rhythm: normal sinus rhythm  QRS Axis: normal  Intervals: normal  ST/T Wave abnormalities: normal  Conduction Disutrbances:none  Narrative Interpretation: R bundle branch block LVH  Old EKG Reviewed: unchanged  Report given to [facility] for R chest pain with no SOB, Fever, cough, normal chest x-ray.  Pain started today. From urgent care. Trop x 1 negative.  EKG with no changes.  No pcp.  Will repeat troponin and add d-dimer.          Remi Haggard, NP 02/11/12 1614

## 2012-02-11 NOTE — ED Notes (Signed)
Patient told that a urine sample is needed. Will call when he has to urinate.

## 2012-02-11 NOTE — ED Provider Notes (Signed)
Medical screening examination/treatment/procedure(s) were performed by non-physician practitioner and as supervising physician I was immediately available for consultation/collaboration.   Mateus Rewerts, MD 02/11/12 2143 

## 2012-02-16 NOTE — ED Provider Notes (Signed)
Medical screening examination/treatment/procedure(s) were performed by non-physician practitioner and as supervising physician I was immediately available for consultation/collaboration.  Lesbia Ottaway, MD 02/16/12 0928 

## 2012-10-29 ENCOUNTER — Emergency Department (INDEPENDENT_AMBULATORY_CARE_PROVIDER_SITE_OTHER)
Admission: EM | Admit: 2012-10-29 | Discharge: 2012-10-29 | Disposition: A | Discharge status: Home / Self Care | Payer: Self-pay | Source: Home / Self Care | Attending: Family Medicine | Admitting: Family Medicine

## 2012-10-29 ENCOUNTER — Encounter (HOSPITAL_COMMUNITY): Payer: Self-pay

## 2012-10-29 DIAGNOSIS — L270 Generalized skin eruption due to drugs and medicaments taken internally: Secondary | ICD-10-CM

## 2012-10-29 DIAGNOSIS — J309 Allergic rhinitis, unspecified: Secondary | ICD-10-CM

## 2012-10-29 DIAGNOSIS — J45909 Unspecified asthma, uncomplicated: Secondary | ICD-10-CM

## 2012-10-29 DIAGNOSIS — L299 Pruritus, unspecified: Secondary | ICD-10-CM

## 2012-10-29 LAB — CBC WITH DIFFERENTIAL/PLATELET
Basophils Absolute: 0 10*3/uL (ref 0.0–0.1)
Basophils Relative: 0 % (ref 0–1)
Eosinophils Relative: 7 % — ABNORMAL HIGH (ref 0–5)
HCT: 46.1 % (ref 39.0–52.0)
Lymphocytes Relative: 31 % (ref 12–46)
MCHC: 34.7 g/dL (ref 30.0–36.0)
MCV: 88.1 fL (ref 78.0–100.0)
Monocytes Absolute: 0.7 10*3/uL (ref 0.1–1.0)
Monocytes Relative: 8 % (ref 3–12)
RDW: 13 % (ref 11.5–15.5)

## 2012-10-29 LAB — COMPREHENSIVE METABOLIC PANEL
AST: 18 U/L (ref 0–37)
BUN: 13 mg/dL (ref 6–23)
CO2: 28 mEq/L (ref 19–32)
Calcium: 10 mg/dL (ref 8.4–10.5)
Creatinine, Ser: 1.17 mg/dL (ref 0.50–1.35)
GFR calc non Af Amer: 69 mL/min — ABNORMAL LOW (ref >90)

## 2012-10-29 MED ORDER — METHYLPREDNISOLONE SODIUM SUCC 125 MG IJ SOLR
INTRAMUSCULAR | Status: AC
  Filled 2012-10-29: qty 2

## 2012-10-29 MED ORDER — ALBUTEROL SULFATE (5 MG/ML) 0.5% IN NEBU
2.5000 mg | INHALATION_SOLUTION | Freq: Once | RESPIRATORY_TRACT | Status: AC
  Administered 2012-10-29: 2.5 mg via RESPIRATORY_TRACT

## 2012-10-29 MED ORDER — ALBUTEROL SULFATE HFA 108 (90 BASE) MCG/ACT IN AERS: 2.0000 | INHALATION_SPRAY | Freq: Four times a day (QID) | RESPIRATORY_TRACT | Status: DC | PRN

## 2012-10-29 MED ORDER — ALBUTEROL SULFATE (5 MG/ML) 0.5% IN NEBU
INHALATION_SOLUTION | RESPIRATORY_TRACT | Status: AC
  Filled 2012-10-29: qty 0.5

## 2012-10-29 MED ORDER — DIPHENHYDRAMINE HCL 12.5 MG/5ML PO ELIX: 25.0000 mg | ORAL_SOLUTION | Freq: Once | ORAL | Status: DC

## 2012-10-29 MED ORDER — FLUTICASONE PROPIONATE 50 MCG/ACT NA SUSP: 2.0000 | Freq: Every day | NASAL | Status: DC

## 2012-10-29 MED ORDER — IPRATROPIUM BROMIDE 0.02 % IN SOLN
0.5000 mg | Freq: Once | RESPIRATORY_TRACT | Status: AC
  Administered 2012-10-29: 0.5 mg via RESPIRATORY_TRACT

## 2012-10-29 MED ORDER — METHYLPREDNISOLONE SODIUM SUCC 125 MG IJ SOLR
125.0000 mg | Freq: Once | INTRAMUSCULAR | Status: AC
  Administered 2012-10-29: 125 mg via INTRAVENOUS

## 2012-10-29 MED ORDER — LORATADINE 10 MG PO TABS: 10.0000 mg | ORAL_TABLET | Freq: Once | ORAL | Status: DC

## 2012-10-29 MED ORDER — PREDNISONE 10 MG PO TABS: ORAL_TABLET | ORAL | Status: DC

## 2012-10-29 NOTE — ED Notes (Signed)
Patient states took some cough syrup (vicks 44) a couple days ago Now has rash on his right hand and lower abd area

## 2012-10-29 NOTE — ED Provider Notes (Signed)
History     CSN: [id number]  Arrival date & time 10/29/12  1307   First MD Initiated Contact with Patient 10/29/12 1330      Chief Complaint  Patient presents with  . Rash    (Consider location/radiation/quality/duration/timing/severity/associated sxs/prior treatment) The history is provided by the patient.   Pt says that he took some Vick's cough syrup yesterday and then has been developing a rash on his hands and finger and on his lower abdomen and perineum. It has been associated with itching and burning on the skin as well as the rash.  He has had runny nose and nasal congestion.  No sore throat and no trouble breathing.  He says that he has asthma and has been wheezing.  The patient reports no difficulty breathing.  He reports that he does not feel a sensation of his throat closing.  Past Medical History  Diagnosis Date  . Asthma     Past Surgical History  Procedure Laterality Date  . Leg surgery      No family history on file.  History  Substance Use Topics  . Smoking status: Never Smoker   . Smokeless tobacco: Not on file  . Alcohol Use: No      Review of Systems  HENT: Positive for congestion, rhinorrhea, sneezing and postnasal drip.   Respiratory: Positive for wheezing.   Skin: Positive for rash.       Pruritic skin  All other systems reviewed and are negative.    Allergies  Penicillins  Home Medications  No current outpatient prescriptions on file.  BP 125/91  Pulse 61  Temp(Src) 97.8 F (36.6 C) (Oral)  SpO2 100%  Physical Exam  Nursing note and vitals reviewed. Constitutional: He is oriented to person, place, and time. He appears well-developed and well-nourished. No distress.  HENT:  Head: Normocephalic and atraumatic.  Right Ear: External ear normal.  Left Ear: External ear normal.  Nose: Mucosal edema, rhinorrhea and sinus tenderness present.  Mouth/Throat: Oropharynx is clear and moist and mucous membranes are normal.   Pulmonary/Chest: Effort normal. No apnea. No respiratory distress. He has wheezes. He has no rales. He exhibits no tenderness.  Abdominal: Soft. Bowel sounds are normal.  Genitourinary: No penile tenderness.  Musculoskeletal: Normal range of motion.  Neurological: He is alert and oriented to person, place, and time.  Skin: Skin is warm and dry. Rash noted.  Purpuric rash involving the intertriginous area of fingers 3 and 4 of the right hand approximately 5 cm in diameter.  Mild slightly erythematous rash in the perineal area involving the mons pubis and penis   Dry skin noted    ED Course  Procedures (including critical care time)  Labs Reviewed - No data to display No results found.   No diagnosis found.   MDM  IMPRESSION  Drug Rash   Allergic Rhinitis  Asthma   RECOMMENDATIONS / PLAN DuoNeb treatment given in the office which improved patient's wheezing significantly Solumedrol 125 mg IM given in office Albuterol HFA given Prednisone taper given Cbc, cmp ordered Go to ER if symptoms worsen: Patient verbalized understanding  FOLLOW UP 3 days for recheck if no improvement  The patient was given clear instructions to go to ER or return to medical center if symptoms don't improve, worsen or new problems develop.  The patient verbalized understanding.  The patient was told to call to get lab results if they haven't heard anything in the next week.  Cleora Fleet, MD 10/29/12 1549

## 2012-10-31 NOTE — Progress Notes (Signed)
Quick Note:  Please inform patient that his labs came back OK.    Rodney Langton, MD, CDE, FAAFP [facility] [place], [place]   ______

## 2012-11-03 ENCOUNTER — Telehealth (HOSPITAL_COMMUNITY): Payer: Self-pay

## 2012-11-03 NOTE — Telephone Encounter (Signed)
Message copied by Lestine Mount on Mon Nov 03, 2012  9:25 AM ------      Message from: Cleora Fleet      Created: Fri Oct 31, 2012  9:28 AM       Please inform patient that his labs came back OK.                   Rodney Langton, MD, CDE, FAAFP      [facility]      [facility]      [place], [place]        ------

## 2014-11-23 ENCOUNTER — Encounter (HOSPITAL_COMMUNITY): Payer: Self-pay | Admitting: Emergency Medicine

## 2014-11-23 ENCOUNTER — Other Ambulatory Visit (HOSPITAL_COMMUNITY)
Admission: RE | Admit: 2014-11-23 | Discharge: 2014-11-23 | Disposition: A | Discharge status: Home / Self Care | Payer: Self-pay | Source: Ambulatory Visit | Attending: Family Medicine | Admitting: Family Medicine

## 2014-11-23 ENCOUNTER — Emergency Department (INDEPENDENT_AMBULATORY_CARE_PROVIDER_SITE_OTHER)
Admission: EM | Admit: 2014-11-23 | Discharge: 2014-11-23 | Disposition: A | Discharge status: Home / Self Care | Payer: Self-pay | Source: Home / Self Care | Attending: Family Medicine | Admitting: Family Medicine

## 2014-11-23 DIAGNOSIS — N342 Other urethritis: Secondary | ICD-10-CM

## 2014-11-23 DIAGNOSIS — Z113 Encounter for screening for infections with a predominantly sexual mode of transmission: Secondary | ICD-10-CM | POA: Insufficient documentation

## 2014-11-23 NOTE — ED Notes (Signed)
Evaluation for std

## 2014-11-23 NOTE — Discharge Instructions (Signed)
Thank you for coming in today. We will call you if any tests come back positive. Return as needed.  Sexually Transmitted Disease A sexually transmitted disease (STD) is a disease or infection that may be passed (transmitted) from person to person, usually during sexual activity. This may happen by way of saliva, semen, blood, vaginal mucus, or urine. Common STDs include:   Gonorrhea.   Chlamydia.   Syphilis.   HIV and AIDS.   Genital herpes.   Hepatitis B and C.   Trichomonas.   Human papillomavirus (HPV).   Pubic lice.   Scabies.  Mites.  Bacterial vaginosis. WHAT ARE CAUSES OF STDs? An STD may be caused by bacteria, a virus, or parasites. STDs are often transmitted during sexual activity if one person is infected. However, they may also be transmitted through nonsexual means. STDs may be transmitted after:   Sexual intercourse with an infected person.   Sharing sex toys with an infected person.   Sharing needles with an infected person or using unclean piercing or tattoo needles.  Having intimate contact with the genitals, mouth, or rectal areas of an infected person.   Exposure to infected fluids during birth. WHAT ARE THE SIGNS AND SYMPTOMS OF STDs? Different STDs have different symptoms. Some people may not have any symptoms. If symptoms are present, they may include:   Painful or bloody urination.   Pain in the pelvis, abdomen, vagina, anus, throat, or eyes.   A skin rash, itching, or irritation.  Growths, ulcerations, blisters, or sores in the genital and anal areas.  Abnormal vaginal discharge with or without bad odor.   Penile discharge in men.   Fever.   Pain or bleeding during sexual intercourse.   Swollen glands in the groin area.   Yellow skin and eyes (jaundice). This is seen with hepatitis.   Swollen testicles.  Infertility.  Sores and blisters in the mouth. HOW ARE STDs DIAGNOSED? To make a diagnosis, your  health care provider may:   Take a medical history.   Perform a physical exam.   Take a sample of any discharge to examine.  Swab the throat, cervix, opening to the penis, rectum, or vagina for testing.  Test a sample of your first morning urine.   Perform blood tests.   Perform a Pap test, if this applies.   Perform a colposcopy.   Perform a laparoscopy.  HOW ARE STDs TREATED? Treatment depends on the STD. Some STDs may be treated but not cured.   Chlamydia, gonorrhea, trichomonas, and syphilis can be cured with antibiotic medicine.   Genital herpes, hepatitis, and HIV can be treated, but not cured, with prescribed medicines. The medicines lessen symptoms.   Genital warts from HPV can be treated with medicine or by freezing, burning (electrocautery), or surgery. Warts may come back.   HPV cannot be cured with medicine or surgery. However, abnormal areas may be removed from the cervix, vagina, or vulva.   If your diagnosis is confirmed, your recent sexual partners need treatment. This is true even if they are symptom-free or have a negative culture or evaluation. They should not have sex until their health care providers say it is okay. HOW CAN I REDUCE MY RISK OF GETTING AN STD? Take these steps to reduce your risk of getting an STD:  Use latex condoms, dental dams, and water-soluble lubricants during sexual activity. Do not use petroleum jelly or oils.  Avoid having multiple sex partners.  Do not have sex with someone  who has other sex partners.  Do not have sex with anyone you do not know or who is at high risk for an STD.  Avoid risky sex practices that can break your skin.  Do not have sex if you have open sores on your mouth or skin.  Avoid drinking too much alcohol or taking illegal drugs. Alcohol and drugs can affect your judgment and put you in a vulnerable position.  Avoid engaging in oral and anal sex acts.  Get vaccinated for HPV and  hepatitis. If you have not received these vaccines in the past, talk to your health care provider about whether one or both might be right for you.   If you are at risk of being infected with HIV, it is recommended that you take a prescription medicine daily to prevent HIV infection. This is called pre-exposure prophylaxis (PrEP). You are considered at risk if:  You are a man who has sex with other men (MSM).  You are a heterosexual man or woman and are sexually active with more than one partner.  You take drugs by injection.  You are sexually active with a partner who has HIV.  Talk with your health care provider about whether you are at high risk of being infected with HIV. If you choose to begin PrEP, you should first be tested for HIV. You should then be tested every 3 months for as long as you are taking PrEP.  WHAT SHOULD I DO IF I THINK I HAVE AN STD?  See your health care provider.   Tell your sexual partner(s). They should be tested and treated for any STDs.  Do not have sex until your health care provider says it is okay. WHEN SHOULD I GET IMMEDIATE MEDICAL CARE? Contact your health care provider right away if:   You have severe abdominal pain.  You are a man and notice swelling or pain in your testicles.  You are a woman and notice swelling or pain in your vagina. Document Released: 11/24/2002 Document Revised: 09/08/2013 Document Reviewed: 03/24/2013 [facility] Patient Information 2015 [place]. This information is not intended to replace advice given to you by your health care provider. Make sure you discuss any questions you have with your health care provider.

## 2014-11-23 NOTE — ED Provider Notes (Signed)
Larry Shaw is a 59 y.o. male who presents to Urgent Care today for irritation. The girlfriend of the patient has burning with sex and is currently being treated for some pelvic infection. He is here today for testing and treatment. He denies any pain fevers chills. No discharge. He feels well otherwise.   Past Medical History  Diagnosis Date  . Asthma    Past Surgical History  Procedure Laterality Date  . Leg surgery     History  Substance Use Topics  . Smoking status: Never Smoker   . Smokeless tobacco: Not on file  . Alcohol Use: No   ROS as above Medications: No current facility-administered medications for this encounter.   Current Outpatient Prescriptions  Medication Sig Dispense Refill  . albuterol (PROVENTIL HFA;VENTOLIN HFA) 108 (90 BASE) MCG/ACT inhaler Inhale 2 puffs into the lungs every 6 (six) hours as needed for wheezing. 1 Inhaler 2  . fluticasone (FLONASE) 50 MCG/ACT nasal spray Place 2 sprays into the nose daily. 16 g 2   Allergies  Allergen Reactions  . Penicillins      Exam:  BP 120/82 mmHg  Pulse 95  Temp(Src) 99.3 F (37.4 C) (Oral)  Resp 18  SpO2 95% Gen: Well NAD Genitals: No inguinal lymphadenopathy. Testicles descended and nontender without masses bilaterally. Penis is normal-appearing without discharge or lesions.   No results found for this or any previous visit (from the past 24 hour(s)). No results found.  Assessment and Plan: 59 y.o. male with STD test. Patient is currently asymptomatic appearing. Cytology for gonorrhea Chlamydia and Trichomonas as well as serology for HIV and RPR pending. Will call patient with results.  Discussed warning signs or symptoms. Please see discharge instructions. Patient expresses understanding.     Rodolph BongEvan S Khrystal Jeanmarie, MD 11/23/14 [phone]

## 2014-11-24 LAB — URINE CYTOLOGY ANCILLARY ONLY
CHLAMYDIA, DNA PROBE: NEGATIVE
Neisseria Gonorrhea: NEGATIVE
TRICH (WINDOWPATH): NEGATIVE

## 2014-11-24 LAB — RPR: RPR Ser Ql: REACTIVE — AB

## 2014-11-24 LAB — HIV ANTIBODY (ROUTINE TESTING W REFLEX): HIV SCREEN 4TH GENERATION: NONREACTIVE

## 2014-11-24 LAB — RPR, QUANT+TP ABS (REFLEX)
Rapid Plasma Reagin, Quant: 1:1 {titer} — ABNORMAL HIGH
TREPONEMA PALLIDUM AB: POSITIVE — AB

## 2014-11-27 ENCOUNTER — Telehealth (HOSPITAL_COMMUNITY): Payer: Self-pay | Admitting: *Deleted

## 2014-11-27 NOTE — ED Notes (Signed)
GC/Chlamydia neg., Trich neg., HIV non-reactive, RPR reactive, RPR quant 1: 1, T. Pallidum pos.  Dr. Denyse Amassorey said pt. needs to be treated with Penicillin shots  and could not contact him this AM. I called pt. this afternoon.  Home number VM is full and unable to leave a message. No answer on mobile number. Call 1.

## 2014-11-29 NOTE — ED Notes (Signed)
Unable to reach pt. by phone x 3. Confidential marked letter sent with result and instruction to come back for treatment. [name], Larry Shaw M 11/29/2014

## 2014-12-07 ENCOUNTER — Emergency Department (INDEPENDENT_AMBULATORY_CARE_PROVIDER_SITE_OTHER)
Admission: EM | Admit: 2014-12-07 | Discharge: 2014-12-07 | Disposition: A | Discharge status: Home / Self Care | Payer: Self-pay | Source: Home / Self Care | Attending: Family Medicine | Admitting: Family Medicine

## 2014-12-07 ENCOUNTER — Encounter (HOSPITAL_COMMUNITY): Payer: Self-pay | Admitting: Emergency Medicine

## 2014-12-07 DIAGNOSIS — A539 Syphilis, unspecified: Secondary | ICD-10-CM

## 2014-12-07 MED ORDER — ONDANSETRON 4 MG PO TBDP
8.0000 mg | ORAL_TABLET | Freq: Once | ORAL | Status: AC
  Administered 2014-12-07: 8 mg via ORAL

## 2014-12-07 MED ORDER — AZITHROMYCIN 250 MG PO TABS
2000.0000 mg | ORAL_TABLET | Freq: Once | ORAL | Status: AC
  Administered 2014-12-07: 2000 mg via ORAL

## 2014-12-07 MED ORDER — AZITHROMYCIN 250 MG PO TABS
ORAL_TABLET | ORAL | Status: AC
  Filled 2014-12-07: qty 8

## 2014-12-07 MED ORDER — ONDANSETRON 4 MG PO TBDP
ORAL_TABLET | ORAL | Status: AC
Start: 2014-12-07 — End: 2014-12-07
  Filled 2014-12-07: qty 2

## 2014-12-07 MED ORDER — AZITHROMYCIN 250 MG PO TABS: 1000.0000 mg | ORAL_TABLET | Freq: Once | ORAL | Status: DC

## 2014-12-07 NOTE — ED Provider Notes (Signed)
Larry Shaw is a 59 y.o. male who presents to Urgent Care today for syphilis. Patient was seen on March 8 and diagnosed with syphilis with a positive titer. He is here today for treatment. He is allergic to penicillins. Penicillin causes hives it makes him quite sick. He denies any fevers or chills vomiting or diarrhea.   Past Medical History  Diagnosis Date  . Asthma    Past Surgical History  Procedure Laterality Date  . Leg surgery     History  Substance Use Topics  . Smoking status: Never Smoker   . Smokeless tobacco: Not on file  . Alcohol Use: No   ROS as above Medications: Current Facility-Administered Medications  Medication Dose Route Frequency Provider Last Rate Last Dose  . azithromycin (ZITHROMAX) tablet 1,000 mg  1,000 mg Oral Once Rodolph BongEvan S Dioselina Brumbaugh, MD       Current Outpatient Prescriptions  Medication Sig Dispense Refill  . albuterol (PROVENTIL HFA;VENTOLIN HFA) 108 (90 BASE) MCG/ACT inhaler Inhale 2 puffs into the lungs every 6 (six) hours as needed for wheezing. 1 Inhaler 2  . fluticasone (FLONASE) 50 MCG/ACT nasal spray Place 2 sprays into the nose daily. 16 g 2   Allergies  Allergen Reactions  . Penicillins      Exam:  BP 131/88 mmHg  Pulse 46  Temp(Src) 98.6 F (37 C) (Oral)  Resp 18  SpO2 96% Gen: Well NAD   Patient was given 8 mg ODT Zofran, and 2 g oral azithromycin.  No results found for this or any previous visit (from the past 24 hour(s)). No results found.  Assessment and Plan: 59 y.o. male with syphilis. Follow-up with health Department in one month for recheck  Discussed warning signs or symptoms. Please see discharge instructions. Patient expresses understanding.     Rodolph BongEvan S Ailah Barna, MD 12/07/14 1149

## 2014-12-07 NOTE — Discharge Instructions (Signed)
Thank you for coming in today. Follow-up at the health department in about a month for recheck Return as needed  Syphilis Syphilis is an infectious disease. It can cause serious complications if left untreated.  CAUSES  Syphilis is caused by a type of bacteria called Treponema pallidum. It is most commonly spread through sexual contact. Syphilis may also spread to a fetus through the blood of the mother.  SIGNS AND SYMPTOMS Symptoms vary depending on the stage of the disease. Some symptoms may disappear without treatment. However, this does not mean that the infection is gone. One form of syphilis (called latent syphilis) has no symptoms.  Primary Syphilis  Painless sores (chancres) in and around the genital organs and mouth.  Swollen lymph nodes near the sores. Secondary Syphilis  A rash or sores over any portion of the body, including the palms of the hands and soles of the feet.  Fever.  Headache.  Sore throat.  Swollen lymph nodes.  New sores in the mouth or on the genitals.  Feeling generally ill.  Having pain in the joints. Tertiary Syphilis The third stage of syphilis involves severe damage to different organs in the body, such as the brain, spinal cord, and heart. Signs and symptoms may include:   Dementia.  Personality and mood changes.  Difficulty walking.  Heart failure.  Fainting.  Enlargement (aneurysm) of the aorta.  Tumors of the skin, bones, or liver.  Muscle weakness.  Sudden "lightning" pains, numbness, or tingling.  Problems with coordination.  Vision changes. DIAGNOSIS   A physical exam will be done.  Blood tests will be done to confirm the diagnosis.  If the disease is in the first or second stages, a fluid (drainage) sample from a sore or rash may be examined under a microscope to detect the disease-causing bacteria.  Fluid around the spine may need to be examined to detect brain damage or inflammation of the brain lining  (meningitis).  If the disease is in the third stage, X-rays, CT scans, MRIs, echocardiograms, ultrasounds, or cardiac catheterization may also be done to detect disease of the heart, aorta, or brain. TREATMENT  Syphilis can be cured with antibiotic medicine if a diagnosis is made early. During the first day of treatment, you may experience fever, chills, headache, nausea, or aching all over your body. This is a normal reaction to the antibiotics.  HOME CARE INSTRUCTIONS   Take your antibiotic medicine as directed by your health care provider. Finish the antibiotic even if you start to feel better. Incomplete treatment will put you at risk for continued infection and could be life threatening.  Take medicines only as directed by your health care provider.  Do not have sexual intercourse until your treatment is completed or as directed by your health care provider.  Inform your recent sexual partners that you were diagnosed with syphilis. They need to seek care and treatment, even if they have no symptoms. It is necessary that all your sexual partners be tested for infection and treated if they have the disease.  Keep all follow-up visits as directed by your health care provider. It is important to keep all your appointments.  If your test results are not ready during your visit, make an appointment with your health care provider to find out the results. Do not assume everything is normal if you have not heard from your health care provider or the medical facility. It is your responsibility to get your test results. SEEK MEDICAL CARE IF:  You continue to have any of the following 24 hours after beginning treatment:  Fever.  Chills.  Headache.  Nausea.  Aching all over your body.  You have symptoms of an allergic reaction to medicine, such as:  Chills.  A headache.  Light-headedness.  A new rash (especially hives).  Difficulty breathing. MAKE SURE YOU:   Understand these  instructions.  Will watch your condition.  Will get help right away if you are not doing well or get worse. Document Released: 06/24/2013 Document Revised: 01/18/2014 Document Reviewed: 06/24/2013 [facility] Patient Information 2015 [place], [place]. This information is not intended to replace advice given to you by your health care provider. Make sure you discuss any questions you have with your health care provider.

## 2014-12-07 NOTE — ED Notes (Signed)
Follow up to blood draw

## 2015-09-07 ENCOUNTER — Encounter (HOSPITAL_COMMUNITY): Payer: Self-pay | Admitting: Emergency Medicine

## 2015-09-07 ENCOUNTER — Emergency Department (HOSPITAL_COMMUNITY)
Admission: EM | Admit: 2015-09-07 | Discharge: 2015-09-07 | Disposition: A | Discharge status: Home / Self Care | Payer: Self-pay | Attending: Emergency Medicine | Admitting: Emergency Medicine

## 2015-09-07 DIAGNOSIS — J45909 Unspecified asthma, uncomplicated: Secondary | ICD-10-CM | POA: Insufficient documentation

## 2015-09-07 DIAGNOSIS — Z7951 Long term (current) use of inhaled steroids: Secondary | ICD-10-CM | POA: Insufficient documentation

## 2015-09-07 DIAGNOSIS — K047 Periapical abscess without sinus: Secondary | ICD-10-CM | POA: Insufficient documentation

## 2015-09-07 DIAGNOSIS — Z880 Allergy status to penicillin: Secondary | ICD-10-CM | POA: Insufficient documentation

## 2015-09-07 DIAGNOSIS — K029 Dental caries, unspecified: Secondary | ICD-10-CM | POA: Insufficient documentation

## 2015-09-07 DIAGNOSIS — Z79899 Other long term (current) drug therapy: Secondary | ICD-10-CM | POA: Insufficient documentation

## 2015-09-07 MED ORDER — CLINDAMYCIN HCL 150 MG PO CAPS
150.0000 mg | ORAL_CAPSULE | Freq: Once | ORAL | Status: AC
  Administered 2015-09-07: 150 mg via ORAL
  Filled 2015-09-07: qty 1

## 2015-09-07 MED ORDER — OXYCODONE-ACETAMINOPHEN 5-325 MG PO TABS: 2.0000 | ORAL_TABLET | ORAL | Status: DC | PRN

## 2015-09-07 MED ORDER — CLINDAMYCIN HCL 150 MG PO CAPS: 150.0000 mg | ORAL_CAPSULE | Freq: Two times a day (BID) | ORAL | Status: AC

## 2015-09-07 MED ORDER — KETOROLAC TROMETHAMINE 60 MG/2ML IM SOLN
60.0000 mg | Freq: Once | INTRAMUSCULAR | Status: AC
  Administered 2015-09-07: 60 mg via INTRAMUSCULAR
  Filled 2015-09-07: qty 2

## 2015-09-07 NOTE — ED Notes (Signed)
See PA assessment 

## 2015-09-07 NOTE — ED Provider Notes (Signed)
CSN: [id number]     Arrival date & time 09/07/15  0910 History  By signing my name below, I, Larry Shaw, attest that this documentation has been prepared under the direction and in the presence of [employer], PA-C. Electronically Signed: Elon Shaw ED Scribe. 09/07/2015. 9:20 AM.    Chief Complaint  Patient presents with  . Dental Pain   The history is provided by the patient. No language interpreter was used.  HPI Comments: Larry Shaw is a 59 y.o. male who presents to the Emergency Department complaining of 10/10 left lower dental pain swelling onset yesterday that is worse with chewing and unrelieved by Tylenol taken last night.  He denies prior hx of similar episodes.  Patient is not followed by a dentist and has not been seen for this complaint preivously.  He denies fever.  Patient reports allergy to penicillin manifesting as a rash. Past Medical History  Diagnosis Date  . Asthma    Past Surgical History  Procedure Laterality Date  . Leg surgery     History reviewed. No pertinent family history. Social History  Substance Use Topics  . Smoking status: Never Smoker   . Smokeless tobacco: None  . Alcohol Use: No    Review of Systems  Constitutional: Negative for fever.  HENT: Positive for dental problem.       Allergies  Penicillins  Home Medications   Prior to Admission medications   Medication Sig Start Date End Date Taking? Authorizing Provider  albuterol (PROVENTIL HFA;VENTOLIN HFA) 108 (90 BASE) MCG/ACT inhaler Inhale 2 puffs into the lungs every 6 (six) hours as needed for wheezing. 10/29/12   Clanford Cyndie Mull, MD  clindamycin (CLEOCIN) 150 MG capsule Take 1 capsule (150 mg total) by mouth 2 (two) times daily. 09/07/15 09/13/15  Toran Murch Patel-Mills, PA-C  fluticasone (FLONASE) 50 MCG/ACT nasal spray Place 2 sprays into the nose daily. 10/29/12   Clanford Cyndie Mull, MD  oxyCODONE-acetaminophen (PERCOCET/ROXICET) 5-325 MG tablet Take 2 tablets by mouth  every 4 (four) hours as needed for severe pain. 09/07/15   Domnick Chervenak Patel-Mills, PA-C   BP 124/80 mmHg  Pulse 64  Temp(Src) 97.9 F (36.6 C) (Oral)  Resp 20  Ht  (1.753 m)  Wt 93.441 kg  BMI 30.41 kg/m2  SpO2 99% Physical Exam  Constitutional: He is oriented to person, place, and time. He appears well-developed and well-nourished. No distress.  HENT:  Head: Normocephalic and atraumatic.  Mouth/Throat: Oropharynx is clear and moist.  Multiple dental caries and poor dentition throughout.  Mild left-sided facial swelling.  No gum swelling or palpable fluctuance.  No newly fx'd or missing tooth.  Left lower 1st molar is TTP.  Eyes: Conjunctivae and EOM are normal.  Neck: Neck supple. No tracheal deviation present.  Cardiovascular: Normal rate.   Pulmonary/Chest: Effort normal. No respiratory distress.  Musculoskeletal: Normal range of motion.  Neurological: He is alert and oriented to person, place, and time.  Skin: Skin is warm and dry.  Psychiatric: He has a normal mood and affect. His behavior is normal.  Nursing note and vitals reviewed.   ED Course  Procedures (including critical care time)  DIAGNOSTIC STUDIES: Oxygen Saturation is 99% on room air, normal, by my interpretation.    COORDINATION OF CARE:  9:20 AM Will order and prescribe Percocet.  Will prescribe clindamycin and provide dental referral.  Patient will be provided with dental guidebook and referral for f/u.  Patient acknowledges and agrees with plan.  Labs Review Labs Reviewed - No data to display  Imaging Review No results found.    EKG Interpretation None      MDM   Final diagnoses:  Dental abscess  Patient with toothache.  No gross abscess.  Exam unconcerning for Ludwig's angina or spread of infection.  Will treat with clindamycin and percocet.  Urged patient to follow-up with dentist.   Filed Vitals:   09/07/15 0916  BP: 124/80  Pulse: 64  Temp: 97.9 F (36.6 C)  Resp: 20    Medications  clindamycin (CLEOCIN) capsule 150 mg (150 mg Oral Given 09/07/15 0925)  ketorolac (TORADOL) injection 60 mg (60 mg Intramuscular Given 09/07/15 0926)   I personally performed the services described in this documentation, which was scribed in my presence. The recorded information has been reviewed and is accurate.   Catha GosselinHanna Patel-Mills, PA-C 09/07/15 [id number]  Loren Raceravid Yelverton, MD 09/11/15 [phone]

## 2015-09-07 NOTE — Discharge Instructions (Signed)
Dental Abscess °A dental abscess is a collection of pus in or around a tooth. °CAUSES °This condition is caused by a bacterial infection around the root of the tooth that involves the inner part of the tooth (pulp). It may result from: °· Severe tooth decay. °· Trauma to the tooth that allows bacteria to enter into the pulp, such as a broken or chipped tooth. °· Severe gum disease around a tooth. °SYMPTOMS °Symptoms of this condition include: °· Severe pain in and around the infected tooth. °· Swelling and redness around the infected tooth, in the mouth, or in the face. °· Tenderness. °· Pus drainage. °· Bad breath. °· Bitter taste in the mouth. °· Difficulty swallowing. °· Difficulty opening the mouth. °· Nausea. °· Vomiting. °· Chills. °· Swollen neck glands. °· Fever. °DIAGNOSIS °This condition is diagnosed with examination of the infected tooth. During the exam, your dentist may tap on the infected tooth. Your dentist will also ask about your medical and dental history and may order X-rays. °TREATMENT °This condition is treated by eliminating the infection. This may be done with: °· Antibiotic medicine. °· A root canal. This may be performed to save the tooth. °· Pulling (extracting) the tooth. This may also involve draining the abscess. This is done if the tooth cannot be saved. °HOME CARE INSTRUCTIONS °· Take medicines only as directed by your dentist. °· If you were prescribed antibiotic medicine, finish all of it even if you start to feel better. °· Rinse your mouth (gargle) often with salt water to relieve pain or swelling. °· Do not drive or operate heavy machinery while taking pain medicine. °· Do not apply heat to the outside of your mouth. °· Keep all follow-up visits as directed by your dentist. This is important. °SEEK MEDICAL CARE IF: °· Your pain is worse and is not helped by medicine. °SEEK IMMEDIATE MEDICAL CARE IF: °· You have a fever or chills. °· Your symptoms suddenly get worse. °· You have a  very bad headache. °· You have problems breathing or swallowing. °· You have trouble opening your mouth. °· You have swelling in your neck or around your eye. °  °This information is not intended to replace advice given to you by your health care provider. Make sure you discuss any questions you have with your health care provider. °  °Document Released: 09/03/2005 Document Revised: 01/18/2015 Document Reviewed: 08/31/2014 °Elsevier Interactive Patient Education ©2016 Elsevier Inc. ° °Emergency Department Resource Guide °1) Find a Doctor and Pay Out of Pocket °Although you won't have to find out who is covered by your insurance plan, it is a good idea to ask around and get recommendations. You will then need to call the office and see if the doctor you have chosen will accept you as a new patient and what types of options they offer for patients who are self-pay. Some doctors offer discounts or will set up payment plans for their patients who do not have insurance, but you will need to ask so you aren't surprised when you get to your appointment. ° °2) Contact Your Local Health Department °Not all health departments have doctors that can see patients for sick visits, but many do, so it is worth a call to see if yours does. If you don't know where your local health department is, you can check in your phone book. The CDC also has a tool to help you locate your state's health department, and many state websites also have listings   of all of their local health departments. ° °3) Find a Walk-in Clinic °If your illness is not likely to be very severe or complicated, you may want to try a walk in clinic. These are popping up all over the country in pharmacies, drugstores, and shopping centers. They're usually staffed by nurse practitioners or physician assistants that have been trained to treat common illnesses and complaints. They're usually fairly quick and inexpensive. However, if you have serious medical issues or  chronic medical problems, these are probably not your best option. ° °No Primary Care Doctor: °- Call [facility] at  832-8000 - they can help you locate a primary care doctor that  accepts your insurance, provides certain services, etc. °- Physician Referral Service- 1-800-533-3463 ° °Chronic Pain Problems: °Organization         Address  Phone   Notes  °[facility]  (336) 297-2271 Patients need to be referred by their primary care doctor.  ° °Medication Assistance: °Organization         Address  Phone   Notes  °[place] [facility] [address] °[place], [place] [address] (336) 641-8030 --Must be a resident of [place] °-- Must have NO insurance coverage whatsoever (no Medicaid/ Medicare, etc.) °-- The pt. MUST have a primary care doctor that directs their care regularly and follows them in the community °  °[facility]  (866) 331-1348   °[employer]  (888) 892-1162   ° °Agencies that provide inexpensive medical care: °Organization         Address  Phone   Notes  °[facility]  (336) 832-8035   ° Internal Medicine    (336) 832-7272   °[facility] [address] °[place], [place] [address] (336) 832-4777   °[facility] [address], °[place] (336) 271-4999   °[facility]    (336) 373-0678   °[facility]    (336) 272-1050   °[facility] ° [address], [place] Phone:  (336) 832-4444, Fax:  (336) 832-4440 Hours of Operation:  9 am - 6 pm, M-F.  Also accepts Medicaid/Medicare and self-pay.  °[facility] ° [address], [place] Phone: (336) 832-3150, Fax: (336) 832-3151. Hours of Operation:  8:30 am - 5:30 pm, M-F.  Also accepts Medicaid and self-pay.  °[facility] [place] [address], [place] Phone: (336) 878-6027   °[facility] [address], [place], [place]  (336)723-1848, Ext. 123 Mondays & Thursdays: 7-9 AM.  First 15 patients are seen on a first come, first serve basis. °  ° °Medicaid-accepting [place] Providers: ° °Organization         Address  Phone   Notes  °[facility] [address], [place] (336) 641-2100 Also accepts self-pay patients.  °[facility] [address], [place] ° (336) 856-9996   °[facility] [address], [place] (336) 288-8857   °[facility] [address] [place] [address], [place] (336) 299-7000   °Veita Bland [address], [place]  ° (336) 373-1557 Only accepts [place] Access Medicaid patients after they have their name applied to their card.  ° °Self-Pay (no insurance) in [place]: ° °Organization         Address  Phone   Notes  °  Sickle Cell Patients, [facility] [address], [place] (336) 832-1970   °[facility] [address], [place] (336) 832-4400   °[facility] ° [address], [place] (336) 992-4800   °[facility]/Dr. Osei-Bonsu ° [address] [place] [address], [place] or [address], [place] (336) 841-8500 Phone number for both [place] and [place] locations is the same.  °[facility] [address], [place] (336) 299-0000   °[facility] [address] [place] [address], [place] or [address] (336) 852-7530 °(336) 878-2260   °[facility] [address], [place] (336) 350-1642, phone; (336) 294-5005, fax Sees patients 1st and 3rd Saturday of every month.  Must not qualify for public or private insurance (i.e. Medicaid, Medicare, [place] Health Choice, Veterans' Benefits) • Household income should be no more than 200% of the poverty level •The clinic cannot treat you if you are pregnant or think you are pregnant • Sexually transmitted  diseases are not treated at the clinic.  ° ° °Dental Care: °Organization         Address  Phone  Notes  °[facility] [address], [place] (336) 641-6152 Accepts children up to age 21 who are enrolled in Medicaid or [place] Health Choice; pregnant women with a Medicaid card; and children who have applied for Medicaid or [place] Health Choice, but were declined, whose parents can pay a reduced fee at time of service.  °[facility] [place]  [address], [place] (336) 641-7733 Accepts children up to age 21 who are enrolled in Medicaid or [place] Health Choice; pregnant women with a Medicaid card; and children who have applied for Medicaid or [place] Health Choice, but were declined, whose parents can pay a reduced fee at time of service.  °[facility] ° [address], [place] (336) 641-4533 Patients are seen by appointment only. Walk-ins are not accepted. [facility] will see patients 18 years of age and older. °Monday - Tuesday (8am-5pm) °Most Wednesdays (8:30-5pm) °$30 per visit, cash only  °[facility] ° [address], [place] (336) 641-4533 Patients are seen by appointment only. Walk-ins are not accepted. [facility] will see patients 18 years of age and older. °One Wednesday Evening (Monthly: Volunteer Based).  $30 per visit, cash only  °[facility]  (919) 537-3737 for adults; Children under age 4, call Graduate Pediatric Dentistry at (919) [phone]. Children aged 4-14, please call (919) [phone] to request a pediatric application. ° Dental services are provided in all areas of dental care including fillings, crowns and bridges, complete and partial dentures, implants, gum treatment, root canals, and extractions. Preventive care is also provided. Treatment is provided to both adults and children. °Patients are selected via a  lottery and there is often a waiting list. °  °[facility] [address], °[place] ° (336) 763-8833 [email or web address] °  °[facility] [address], [place],  (336)723-1848, Ext. 123 Second and Fourth Thursday of each month, opens at 6:30 AM; Clinic ends at 9 AM.  Patients are seen on a first-come first-served basis, and a limited number are seen during each clinic.  ° °[facility] ° [address], [place],  (336) 723-7904   Eligibility   Requirements °You must have lived in [place], [place], or [place] counties for at least the last three months. °  You cannot be eligible for state or federal sponsored healthcare insurance, including Veterans Administration, Medicaid, or Medicare. °  You generally cannot be eligible for healthcare insurance through your employer.  °  How to apply: °Eligibility screenings are held every Tuesday and Wednesday afternoon from 1:00 pm until 4:00 pm. You do not need an appointment for the interview!  °[facility] [address], [place], [place] 336-631-2330   °[facility]  336-342-8273   °[facility]  336-703-3100   °[facility]  336-570-6415   ° °Behavioral Health Resources in the Community: °Intensive Outpatient Programs °Organization         Address  Phone  Notes  °[facility] [address], [place], [place] 336-878-6098   °[facility] [address], [place], [place] 336-832-9800   °[facility] [address], [place], [place] ° 336-882-2125   °[facility] [address],  °[place], [place] 1-800-853-5163 or 336-641-4981   °Substance Abuse Resources °Organization         Address  Phone  Notes  °[facility]  336-882-2125   °[facility]  336-784-9470   °[facility]  336-285-9073   °[facility]  336-845-3988   °Residential &  Outpatient Substance Abuse Program  1-800-659-3381   °Psychological Services °Organization         Address  Phone  Notes  °[facility]  336- 832-9600   °[facility]  336- 378-7881   °[facility] [address], [place] 1-800-853-5163 or 336-641-4981   ° °Mobile Crisis Teams °Organization         Address  Phone  Notes  °[facility], Mobile Crisis Care Unit  1-877-626-1772   °Assertive °[facility] ° [address] [place], [place] 336-834-9664   °Sharon DeEsch [address] °[place] 336-554-5454   ° °Self-Help/Support Groups °Organization         Address  Phone             Notes  °[facility] [place] - variety of support groups  336- 373-1402 Call for more information  °Narcotics Anonymous (NA), [facility] [address], °[place]  2 meetings at this location  ° °Residential Treatment Programs °Organization         Address  Phone  Notes  °[facility] [address],    °[place]  1-866-801-8205   °[facility] ° [address], [place], [place] 704-293-8524   °[facility] [address], [place] 336-845-3988 Admissions: 8am-3pm M-F  °[facility] [address]    °[place], [place] 336-841-1104   °[facility] [address], [place], [place] 336-379-7146   °[facility] [address]  °[place], [place] 336-285-9073   °[facility] Intensive Outpatient [address], [place], [place] 336-852-3033   °[facility] [address]  °[place], [place] 1-877-615-2722 or 336-784-9470   °[facility] [address] [place], [place] 336-227-7417 Accepts Medicaid  °[facility] [address]  °[place]  1-800-659-3381 Substance Abuse/Addiction Treatment  ° °[place] Behavioral Health Resources °Organization            Address  Phone  Notes  °[facility]  (888) 581-9988   °Julie Brannon, PhD [address] [place], [place]   (336) 349-5553 or (336) 951-0000   °[facility]   [address] °[place], [place] (336) 349-4454   °[facility] [address], [place], [place] (336) 342-8316 Insurance/Medicaid/sponsorship through [facility]  °[facility] [address]                                    [place], [place] (336) 342-8316 Therapy/tele-psych/case  °[facility] [address]  ° , [place] (336) 349-2233    °Dr. Arfeen  (336) 349-4544   °[facility] [place]  [employer] [place] [facility] 1) [address],  °2) [address], [place] °3)  [address], [place] (336) 349-3220 °(336) 342-7768 ° °(336) 342-8140   °[place] [facility] (336) 342-1394 or (336) 342-3537 (After Hours)    ° ° °

## 2015-09-07 NOTE — ED Notes (Signed)
Pt to Er with complaint of left dental pain with swelling that began last night. Pt reports difficulty eating. Pt reports taking tylenol last night for pain without relief. Pt in NAD. A/O x4.

## 2015-11-10 ENCOUNTER — Emergency Department (HOSPITAL_COMMUNITY)
Admission: EM | Admit: 2015-11-10 | Discharge: 2015-11-10 | Disposition: A | Discharge status: Home / Self Care | Payer: Medicaid Other | Attending: Emergency Medicine | Admitting: Emergency Medicine

## 2015-11-10 ENCOUNTER — Encounter (HOSPITAL_COMMUNITY): Payer: Self-pay | Admitting: Emergency Medicine

## 2015-11-10 DIAGNOSIS — R6883 Chills (without fever): Secondary | ICD-10-CM | POA: Insufficient documentation

## 2015-11-10 DIAGNOSIS — Z7951 Long term (current) use of inhaled steroids: Secondary | ICD-10-CM | POA: Diagnosis not present

## 2015-11-10 DIAGNOSIS — R0981 Nasal congestion: Secondary | ICD-10-CM | POA: Diagnosis present

## 2015-11-10 DIAGNOSIS — R51 Headache: Secondary | ICD-10-CM | POA: Insufficient documentation

## 2015-11-10 DIAGNOSIS — J3489 Other specified disorders of nose and nasal sinuses: Secondary | ICD-10-CM | POA: Diagnosis not present

## 2015-11-10 DIAGNOSIS — J45909 Unspecified asthma, uncomplicated: Secondary | ICD-10-CM | POA: Insufficient documentation

## 2015-11-10 DIAGNOSIS — Z79899 Other long term (current) drug therapy: Secondary | ICD-10-CM | POA: Diagnosis not present

## 2015-11-10 DIAGNOSIS — Z880 Allergy status to penicillin: Secondary | ICD-10-CM | POA: Insufficient documentation

## 2015-11-10 MED ORDER — CETIRIZINE-PSEUDOEPHEDRINE ER 5-120 MG PO TB12: 1.0000 | ORAL_TABLET | Freq: Two times a day (BID) | ORAL | Status: DC

## 2015-11-10 MED ORDER — SALINE SPRAY 0.65 % NA SOLN: 1.0000 | NASAL | Status: DC | PRN

## 2015-11-10 MED ORDER — CROMOLYN SODIUM 5.2 MG/ACT NA AERS: 1.0000 | INHALATION_SPRAY | Freq: Four times a day (QID) | NASAL | Status: AC

## 2015-11-10 NOTE — ED Notes (Signed)
Pt presents to ED for assessment of nasal congestion and dizziness.

## 2015-11-10 NOTE — Discharge Instructions (Signed)

## 2015-11-10 NOTE — ED Provider Notes (Signed)
CSN: [id number]     Arrival date & time 11/10/15  1947 History  By signing my name below, I, Larry Shaw, attest that this documentation has been prepared under the direction and in the presence of Larry Morn, NP. Electronically Signed: Lyndel Shaw, ED Scribe. 11/10/2015. 9:36 PM.   Chief Complaint  Patient presents with  . Nasal Congestion   Patient is a 60 y.o. male presenting with URI. The history is provided by the patient. No language interpreter was used.  URI Presenting symptoms: congestion ( nasal) and rhinorrhea   Presenting symptoms: no cough and no fever   Severity:  Mild Onset quality:  Gradual Duration:  4 days Timing:  Constant Progression:  Worsening Chronicity:  New Exacerbated by: lying supine. Ineffective treatments: hot showers. Associated symptoms: sinus pain    HPI Comments: Larry Shaw is a 60 y.o. male, with a h/o asthma, who presents to the Emergency Department complaining of gradually worsening rhinorrhea, nasal congestion and a sinus headache that have been persistent for 4 days. He describes the rhinorrhea to be clear and thin. Pt associates chills but denies a fever or cough.   Past Medical History  Diagnosis Date  . Asthma    Past Surgical History  Procedure Laterality Date  . Leg surgery     History reviewed. No pertinent family history. Social History  Substance Use Topics  . Smoking status: Never Smoker   . Smokeless tobacco: None  . Alcohol Use: No    Review of Systems  Constitutional: Positive for chills. Negative for fever.  HENT: Positive for congestion ( nasal), rhinorrhea and sinus pressure.   Respiratory: Negative for cough.   All other systems reviewed and are negative.  Allergies  Penicillins  Home Medications   Prior to Admission medications   Medication Sig Start Date End Date Taking? Authorizing Provider  albuterol (PROVENTIL HFA;VENTOLIN HFA) 108 (90 BASE) MCG/ACT inhaler Inhale 2 puffs into the lungs every  6 (six) hours as needed for wheezing. 10/29/12   [place] Cyndie Mull, MD  fluticasone (FLONASE) 50 MCG/ACT nasal spray Place 2 sprays into the nose daily. 10/29/12   [place] Cyndie Mull, MD  oxyCODONE-acetaminophen (PERCOCET/ROXICET) 5-325 MG tablet Take 2 tablets by mouth every 4 (four) hours as needed for severe pain. 09/07/15   Hanna Patel-Mills, PA-C   BP 138/100 mmHg  Pulse 77  Temp(Src) 99.8 F (37.7 C) (Oral)  Resp 16  SpO2 97% Physical Exam  Constitutional: He is oriented to person, place, and time. He appears well-developed and well-nourished. No distress.  HENT:  Head: Normocephalic.  Mouth/Throat: Oropharynx is clear and moist. No oropharyngeal exudate.  Frontal maxillary sinus tenderness. Clear nasal discharge.  Eyes: Conjunctivae are normal.  Neck: Normal range of motion. Neck supple.  Cardiovascular: Normal rate, regular rhythm and normal heart sounds.   Pulmonary/Chest: Effort normal and breath sounds normal. No respiratory distress. He has no wheezes. He has no rales.  Musculoskeletal: Normal range of motion.  Neurological: He is alert and oriented to person, place, and time. Coordination normal.  Skin: Skin is warm. No rash noted.  Psychiatric: He has a normal mood and affect. His behavior is normal.  Nursing note and vitals reviewed.   ED Course  Procedures  DIAGNOSTIC STUDIES: Oxygen Saturation is 97% on RA, normal by my interpretation.    COORDINATION OF CARE: 9:30 PM Discussed treatment plan with pt at bedside and pt agreed to plan.   MDM   Final diagnoses:  None  Pt symptoms consistent with URI. Pt will be discharged with symptomatic treatment.  Discussed return precautions.  Pt is hemodynamically stable & in NAD prior to discharge.  I personally performed the services described in this documentation, which was scribed in my presence. The recorded information has been reviewed and is accurate.    Larry Morn, NP 11/11/15 0981  Rolan Bucco,  MD 11/12/15 0700

## 2015-11-10 NOTE — ED Notes (Signed)
Patient able to ambulate independently  

## 2015-12-26 ENCOUNTER — Emergency Department (HOSPITAL_BASED_OUTPATIENT_CLINIC_OR_DEPARTMENT_OTHER)
Admit: 2015-12-26 | Discharge: 2015-12-26 | Disposition: A | Discharge status: Home / Self Care | Payer: Medicaid Other | Attending: Emergency Medicine | Admitting: Emergency Medicine

## 2015-12-26 ENCOUNTER — Encounter (HOSPITAL_COMMUNITY): Payer: Self-pay | Admitting: Emergency Medicine

## 2015-12-26 ENCOUNTER — Emergency Department (HOSPITAL_COMMUNITY)
Admission: EM | Admit: 2015-12-26 | Discharge: 2015-12-26 | Disposition: A | Discharge status: Home / Self Care | Payer: Medicaid Other | Attending: Emergency Medicine | Admitting: Emergency Medicine

## 2015-12-26 DIAGNOSIS — Z880 Allergy status to penicillin: Secondary | ICD-10-CM | POA: Diagnosis not present

## 2015-12-26 DIAGNOSIS — G5701 Lesion of sciatic nerve, right lower limb: Secondary | ICD-10-CM | POA: Insufficient documentation

## 2015-12-26 DIAGNOSIS — Z7951 Long term (current) use of inhaled steroids: Secondary | ICD-10-CM | POA: Insufficient documentation

## 2015-12-26 DIAGNOSIS — R531 Weakness: Secondary | ICD-10-CM | POA: Diagnosis not present

## 2015-12-26 DIAGNOSIS — M79609 Pain in unspecified limb: Secondary | ICD-10-CM

## 2015-12-26 DIAGNOSIS — Z79899 Other long term (current) drug therapy: Secondary | ICD-10-CM | POA: Diagnosis not present

## 2015-12-26 DIAGNOSIS — J45909 Unspecified asthma, uncomplicated: Secondary | ICD-10-CM | POA: Diagnosis not present

## 2015-12-26 DIAGNOSIS — M79604 Pain in right leg: Secondary | ICD-10-CM | POA: Diagnosis present

## 2015-12-26 MED ORDER — IBUPROFEN 800 MG PO TABS
800.0000 mg | ORAL_TABLET | Freq: Once | ORAL | Status: AC
  Administered 2015-12-26: 800 mg via ORAL
  Filled 2015-12-26: qty 1

## 2015-12-26 MED ORDER — ACETAMINOPHEN 500 MG PO TABS
1000.0000 mg | ORAL_TABLET | Freq: Once | ORAL | Status: AC
  Administered 2015-12-26: 1000 mg via ORAL
  Filled 2015-12-26: qty 2

## 2015-12-26 NOTE — Discharge Instructions (Signed)
Take 4 over the counter ibuprofen tablets 3 times a day or 2 over-the-counter naproxen tablets twice a day for pain.  Do the stretches 3 times a day for 20-30 seconds at a time. Piriformis Syndrome With Rehab Piriformis syndrome is a condition the affects the nervous system in the area of the hip, and is characterized by pain and possibly a loss of feeling in the backside (posterior) thigh that may extend down the entire length of the leg. The symptoms are caused by an increase in pressure on the sciatic nerve by the piriformis muscle, which is on the back of the hip and is responsible for externally rotating the hip. The sciatic nerve and its branches connect to much of the leg. Normally the sciatic nerve runs between the piriformis muscle and other muscles. However, in certain individuals the nerve runs through the muscle, which causes an increase in pressure on the nerve and results in the symptoms of piriformis syndrome. SYMPTOMS   Pain, tingling, numbness, or burning in the back of the thigh that may also extend down the entire leg.  Occasionally, tenderness in the buttock.  Loss of function of the leg.  Pain that worsens when using the piriformis muscle (running, jumping, or stairs).  Pain that increases with prolonged sitting.  Pain that is lessened by lying flat on the back. CAUSES   Piriformis syndrome is the result of an increase in pressure placed on the sciatic nerve. Oftentimes, piriformis syndrome is an overuse injury.  Stress placed on the nerve from a sudden increase in the intensity, frequency, or duration of training.  Compensation of other extremity injuries. RISK INCREASES WITH:  Sports that involve the piriformis muscle (running, walking, or jumping).  You are born with (congenital) a defect in which the sciatic nerve passes through the muscle. PREVENTION  Warm up and stretch properly before activity.  Allow for adequate recovery between workouts.  Maintain  physical fitness:  Strength, flexibility, and endurance.  Cardiovascular fitness. PROGNOSIS  If treated properly, the symptoms of piriformis syndrome usually resolve in 2 to 6 weeks. RELATED COMPLICATIONS   Persistent and possibly permanent pain and numbness in the lower extremity.  Weakness of the extremity that may progress to disability and inability to compete. TREATMENT  The most effective treatment for piriformis syndrome is rest from any activities that aggravate the symptoms. Ice and pain medication may help reduce pain and inflammation. The use of strengthening and stretching exercises may help reduce pain with activity. These exercises may be performed at home or with a therapist. A referral to a therapist may be given for further evaluation and treatment, such as ultrasound. Corticosteroid injections may be given to reduce inflammation that is causing pressure to be placed on the sciatic nerve. If nonsurgical (conservative) treatment is unsuccessful, then surgery may be recommended.  MEDICATION   If pain medication is necessary, then nonsteroidal anti-inflammatory medications, such as aspirin and ibuprofen, or other minor pain relievers, such as acetaminophen, are often recommended.  Do not take pain medication for 7 days before surgery.  Prescription pain relievers may be given if deemed necessary by your caregiver. Use only as directed and only as much as you need.  Corticosteroid injections may be given by your caregiver. These injections should be reserved for the most serious cases, because they may only be given a certain number of times. HEAT AND COLD:   Cold treatment (icing) relieves pain and reduces inflammation. Cold treatment should be applied for 10 to 15  minutes every 2 to 3 hours for inflammation and pain and immediately after any activity that aggravates your symptoms. Use ice packs or massage the area with a piece of ice (ice massage).  Heat treatment may be  used prior to performing the stretching and strengthening activities prescribed by your caregiver, physical therapist, or athletic trainer. Use a heat pack or soak the injury in warm water. SEEK IMMEDIATE MEDICAL CARE IF:  Treatment seems to offer no benefit, or the condition worsens.  Any medications produce adverse side effects. EXERCISES RANGE OF MOTION (ROM) AND STRETCHING EXERCISES - Piriformis Syndrome These exercises may help you when beginning to rehabilitate your injury. Your symptoms may resolve with or without further involvement from your physician, physical therapist, or athletic trainer. While completing these exercises, remember:   Restoring tissue flexibility helps normal motion to return to the joints. This allows healthier, less painful movement and activity.  An effective stretch should be held for at least 30 seconds.  A stretch should never be painful. You should only feel a gentle lengthening or release in the stretched tissue. STRETCH - Hip Rotators  Lie on your back on a firm surface. Grasp your right / left knee with your right / left hand and your ankle with your opposite hand.  Keeping your hips and shoulders firmly planted, gently pull your right / left knee and rotate your lower leg toward your opposite shoulder until you feel a stretch in your buttocks.  Hold this stretch for __________ seconds. Repeat this stretch __________ times. Complete this stretch __________ times per day. STRETCH - Iliotibial Band  On the floor or bed, lie on your side so your right / left leg is on top. Bend your knee and grab your ankle.  Slowly bring your knee back so that your thigh is in line with your trunk. Keep your heel at your buttocks and gently arch your back so your head, shoulders, and hips line up.  Slowly lower your leg so that your knee approaches the floor/bed until you feel a gentle stretch on the outside of your right / left thigh. If you do not feel a stretch and  your knee will not fall farther, place the heel of your opposite foot on top of your knee and pull your thigh down farther.  Hold this stretch for __________ seconds. Repeat __________ times. Complete __________ times per day. STRENGTHENING EXERCISES - Piriformis Syndrome  These are some of the caregiver again or until your symptoms are resolved. Remember:   Strong muscles with good endurance tolerate stress better.  Do the exercises as initially prescribed by your caregiver. Progress slowly with each exercise, gradually increasing the number of repetitions and weight used under their guidance. STRENGTH - Hip Abductors, Straight Leg Raises Be aware of your form throughout the entire exercise so that you exercise the correct muscles. Sloppy form means that you are not strengthening the correct muscles.  Lie on your side so that your head, shoulders, knee, and hip line up. You may bend your lower knee to help maintain your balance. Your right / left leg should be on top.  Roll your hips slightly forward, so that your hips are stacked directly over each other and your right / left knee is facing forward.  Lift your top leg up 4-6 inches, leading with your heel. Be sure that your foot does not drift forward or that your knee does not roll toward the ceiling.  Hold this position for __________ seconds.  You should feel the muscles in your outer hip lifting (you may not notice this until your leg begins to tire).  Slowly lower your leg to the starting position. Allow the muscles to fully relax before beginning the next repetition. Repeat __________ times. Complete this exercise __________ times per day.  STRENGTH - Hip Abductors, Quadruped  On a firm, lightly padded surface, position yourself on your hands and knees. Your hands should be directly below your shoulders and your knees should be directly below your hips.  Keeping your right / left knee bent, lift your leg out to the side. Keep your  legs level and in line with your shoulders.  Position yourself on your hands and knees.  Hold for __________ seconds.  Keeping your trunk steady and your hips level, slowly lower your leg to the starting position. Repeat __________ times. Complete this exercise __________ times per day.  STRENGTH - Hip Abductors, Standing  Tie one end of a rubber exercise band/tubing to a secure surface (table, pole) and tie a loop at the other end.  Place the loop around your right / left ankle. Keeping your ankle with the band directly opposite of the secured end, step away until there is tension in the tube/band.  Hold onto a chair as needed for balance.  Keeping your back upright, your shoulders over your hips, and your toes pointing forward, lift your right / left leg out to your side. Be sure to lift your leg with your hip muscles. Do not "throw" your leg or tip your body to lift your leg.  Slowly and with control, return to the starting position. Repeat exercise __________ times. Complete this exercise __________ times per day.    This information is not intended to replace advice given to you by your health care provider. Make sure you discuss any questions you have with your health care provider.   Document Released: 09/03/2005 Document Revised: 01/18/2015 Document Reviewed: 12/16/2008 Elsevier Interactive Patient Education [employer]

## 2015-12-26 NOTE — Progress Notes (Signed)
*  Preliminary Results* Bilateral lower extremity venous duplex completed. Bilateral lower extremities are negative for deep vein thrombosis. There is no evidence of Baker's cyst bilaterally.  Patient states that he is not experiencing pain, only numbness when he walks or has to press the gas pedal for 20 minutes or so. Being that these symptoms are suspicious for claudication, bilateral distal posterior tibial and dorsalis pedis arteries were evaluated and found to be patent with triphasic flow at rest.  12/26/2015  Gertie FeyMichelle Jenny Lai, RVT, RDCS, RDMS

## 2015-12-26 NOTE — ED Provider Notes (Signed)
CSN: [id number]     Arrival date & time 12/26/15  1703 History   First MD Initiated Contact with Patient 12/26/15 2140     Chief Complaint  Patient presents with  . Leg Pain     (Consider location/radiation/quality/duration/timing/severity/associated sxs/prior Treatment) Patient is a 60 y.o. male presenting with leg pain. The history is provided by the patient.  Leg Pain Location:  Leg Time since incident:  2 weeks Injury: no   Leg location:  R leg Pain details:    Quality:  Shooting and sharp   Radiates to:  R leg   Severity:  Moderate   Onset quality:  Sudden   Duration:  2 weeks   Timing:  Constant   Progression:  Worsening Chronicity:  New Prior injury to area:  No Relieved by:  Nothing Worsened by:  Activity Ineffective treatments:  None tried Associated symptoms: no fever    60 yo M With a chief complaints of right lower extremity pain. This feels like a numbness and tingling is localized to the lateral aspect of the leg. Sometimes starts at his lower back and travels down the leg but usually is from the calf down and mostly in the foot. Worse with exertion anddriving.  Past Medical History  Diagnosis Date  . Asthma    Past Surgical History  Procedure Laterality Date  . Leg surgery     History reviewed. No pertinent family history. Social History  Substance Use Topics  . Smoking status: Never Smoker   . Smokeless tobacco: None  . Alcohol Use: No    Review of Systems  Constitutional: Negative for fever and chills.  HENT: Negative for congestion and facial swelling.   Eyes: Negative for discharge and visual disturbance.  Respiratory: Negative for shortness of breath.   Cardiovascular: Negative for chest pain and palpitations.  Gastrointestinal: Negative for vomiting, abdominal pain and diarrhea.  Musculoskeletal: Negative for myalgias and arthralgias.  Skin: Negative for color change and rash.  Neurological: Positive for weakness and numbness. Negative  for tremors, syncope and headaches.  Psychiatric/Behavioral: Negative for confusion and dysphoric mood.      Allergies  Penicillins  Home Medications   Prior to Admission medications   Medication Sig Start Date End Date Taking? Authorizing Provider  albuterol (PROVENTIL HFA;VENTOLIN HFA) 108 (90 BASE) MCG/ACT inhaler Inhale 2 puffs into the lungs every 6 (six) hours as needed for wheezing. 10/29/12   Clanford Cyndie MullL Johnson, MD  cetirizine-pseudoephedrine (ZYRTEC-D) 5-120 MG tablet Take 1 tablet by mouth 2 (two) times daily. 11/10/15   Felicie Mornavid Smith, NP  cromolyn (NASALCROM) 5.2 MG/ACT nasal spray Place 1 spray into both nostrils 4 (four) times daily. 11/10/15   Felicie Mornavid Smith, NP  fluticasone [facility](FLONASE) 50 MCG/ACT nasal spray Place 2 sprays into the nose daily. 10/29/12   Clanford Cyndie MullL Johnson, MD  oxyCODONE-acetaminophen (PERCOCET/ROXICET) 5-325 MG tablet Take 2 tablets by mouth every 4 (four) hours as needed for severe pain. 09/07/15   Hanna Patel-Mills, PA-C  sodium chloride (OCEAN) 0.65 % SOLN nasal spray Place 1 spray into both nostrils as needed for congestion. 11/10/15   Felicie Mornavid Smith, NP   BP 127/89 mmHg  Pulse 49  Temp(Src) 97.7 F (36.5 C) (Oral)  Resp 18  Ht 5\' 9"  (1.753 m)  Wt 210 lb (95.255 kg)  BMI 31.00 kg/m2  SpO2 97% Physical Exam  Constitutional: He is oriented to person, place, and time. He appears well-developed and well-nourished.  HENT:  Head: Normocephalic and atraumatic.  Eyes: EOM  are normal. Pupils are equal, round, and reactive to light.  Neck: Normal range of motion. Neck supple. No JVD present.  Cardiovascular: Normal rate and regular rhythm.  Exam reveals no gallop and no friction rub.   No murmur heard. Pulmonary/Chest: No respiratory distress. He has no wheezes.  Abdominal: He exhibits no distension. There is no tenderness. There is no rebound and no guarding.  Musculoskeletal: Normal range of motion. He exhibits edema. He exhibits no tenderness.  PMS intact  distally.   Neurological: He is alert and oriented to person, place, and time.  Skin: No rash noted. No pallor.  Psychiatric: He has a normal mood and affect. His behavior is normal.  Nursing note and vitals reviewed.   ED Course  Procedures (including critical care time) Labs Review Labs Reviewed - No data to display  Imaging Review No results found. I have personally reviewed and evaluated these images and lab results as part of my medical decision-making.   EKG Interpretation None      MDM   Final diagnoses:  Piriformis syndrome of right side    60 yo M with a chief complaint of right lower extremity numbness. It is reproducible with palpation of the P reformats. Suspect that this is piriformis syndrome. We'll have him treat with NSAIDs and follow-up with his family physician.  11:40 PM:  I have discussed the diagnosis/risks/treatment options with the patient and family and believe the pt to be eligible for discharge home to follow-up with PCP. We also discussed returning to the ED immediately if new or worsening sx occur. We discussed the sx which are most concerning (e.g., sudden worsening pain, fever, inability to tolerate by mouth) that necessitate immediate return. Medications administered to the patient during their visit and any new prescriptions provided to the patient are listed below.  Medications given during this visit Medications  acetaminophen (TYLENOL) tablet 1,000 mg (1,000 mg Oral Given 12/26/15 2219)  ibuprofen (ADVIL,MOTRIN) tablet 800 mg (800 mg Oral Given 12/26/15 2219)    Discharge Medication List as of 12/26/2015 10:06 PM      The patient appears reasonably screen and/or stabilized for discharge and I doubt any other medical condition or other [facility] requiring further screening, evaluation, or treatment in the ED at this time prior to discharge.      Melene Plan, DO 12/26/15 2340

## 2015-12-26 NOTE — ED Notes (Signed)
Pt states he's been having right foot and lower leg pain x 3 days. Pt reports pain is worse today.

## 2016-01-02 ENCOUNTER — Encounter (HOSPITAL_COMMUNITY): Payer: Self-pay | Admitting: Emergency Medicine

## 2016-01-02 ENCOUNTER — Emergency Department (HOSPITAL_COMMUNITY)
Admission: EM | Admit: 2016-01-02 | Discharge: 2016-01-02 | Disposition: A | Discharge status: Home / Self Care | Payer: Medicaid Other | Attending: Emergency Medicine | Admitting: Emergency Medicine

## 2016-01-02 DIAGNOSIS — Z7951 Long term (current) use of inhaled steroids: Secondary | ICD-10-CM | POA: Diagnosis not present

## 2016-01-02 DIAGNOSIS — Z880 Allergy status to penicillin: Secondary | ICD-10-CM | POA: Diagnosis not present

## 2016-01-02 DIAGNOSIS — M79671 Pain in right foot: Secondary | ICD-10-CM | POA: Diagnosis present

## 2016-01-02 DIAGNOSIS — J45909 Unspecified asthma, uncomplicated: Secondary | ICD-10-CM | POA: Diagnosis not present

## 2016-01-02 DIAGNOSIS — G5701 Lesion of sciatic nerve, right lower limb: Secondary | ICD-10-CM | POA: Diagnosis not present

## 2016-01-02 DIAGNOSIS — Z79899 Other long term (current) drug therapy: Secondary | ICD-10-CM | POA: Diagnosis not present

## 2016-01-02 MED ORDER — NAPROXEN 500 MG PO TABS
500.0000 mg | ORAL_TABLET | Freq: Two times a day (BID) | ORAL | Status: DC
Start: 2016-01-02 — End: 2016-02-28

## 2016-01-02 MED ORDER — DEXAMETHASONE SODIUM PHOSPHATE 10 MG/ML IJ SOLN
10.0000 mg | Freq: Once | INTRAMUSCULAR | Status: AC
  Administered 2016-01-02: 10 mg via INTRAMUSCULAR
  Filled 2016-01-02: qty 1

## 2016-01-02 NOTE — ED Provider Notes (Signed)
CSN: [id number]     Arrival date & time 01/02/16  1939 History  By signing my name below, I, Linna DarnerRussell Turner, attest that this documentation has been prepared under the direction and in the presence of non-physician practitioner, Jaynie Crumbleatyana Iasia Forcier, PA-C. Electronically Signed: Linna Darnerussell Turner, Scribe. 01/02/2016. 8:06 PM.    Chief Complaint  Patient presents with  . Foot Pain    The history is provided by the patient. No language interpreter was used.     HPI Comments: Larry Shaw is a 60 y.o. male with no pertinent PMHx who presents to the Emergency Department complaining of sudden onset, constant, worsening right foot pain and numbness for the last two weeks. Pt also endorses right lower leg and right buttock pain/numbness. He notes that he experiences numbness in these areas when he sits for long periods of time; he has been driving a lot recently. He also notes numbness in these areas while ambulating. He endorses sensation loss in his right foot and right toes. He endorses right calf pain exacerbation with palpation. Pt was here one week ago for the same reason. Pt denies numbness/pain in his left lower leg/left foot, recent falls/injuries, weakness, or any other associated symptoms.  Past Medical History  Diagnosis Date  . Asthma    Past Surgical History  Procedure Laterality Date  . Leg surgery     No family history on file. Social History  Substance Use Topics  . Smoking status: Never Smoker   . Smokeless tobacco: None  . Alcohol Use: No    Review of Systems  Musculoskeletal: Positive for arthralgias (right foot, right lower leg, right buttock).  Neurological: Positive for numbness (right lower leg, right foot, right buttock). Negative for weakness.       Positive for sensation loss in right foot and right toes   Allergies  Penicillins  Home Medications   Prior to Admission medications   Medication Sig Start Date End Date Taking? Authorizing Provider  albuterol  (PROVENTIL HFA;VENTOLIN HFA) 108 (90 BASE) MCG/ACT inhaler Inhale 2 puffs into the lungs every 6 (six) hours as needed for wheezing. 10/29/12   Clanford Cyndie MullL Johnson, MD  cetirizine-pseudoephedrine (ZYRTEC-D) 5-120 MG tablet Take 1 tablet by mouth 2 (two) times daily. 11/10/15   Felicie Mornavid Smith, NP  cromolyn (NASALCROM) 5.2 MG/ACT nasal spray Place 1 spray into both nostrils 4 (four) times daily. 11/10/15   Felicie Mornavid Smith, NP  fluticasone [facility](FLONASE) 50 MCG/ACT nasal spray Place 2 sprays into the nose daily. 10/29/12   Clanford Cyndie MullL Johnson, MD  oxyCODONE-acetaminophen (PERCOCET/ROXICET) 5-325 MG tablet Take 2 tablets by mouth every 4 (four) hours as needed for severe pain. 09/07/15   Hanna Patel-Mills, PA-C  sodium chloride (OCEAN) 0.65 % SOLN nasal spray Place 1 spray into both nostrils as needed for congestion. 11/10/15   Felicie Mornavid Smith, NP   BP 113/80 mmHg  Pulse 66  Temp(Src) 97.6 F (36.4 C) (Oral)  Resp 18  SpO2 97% Physical Exam  Constitutional: He appears well-developed and well-nourished. No distress.  HENT:  Head: Normocephalic.  Eyes: Conjunctivae are normal.  Neck: Normal range of motion. Neck supple.  Cardiovascular: Normal rate, regular rhythm and normal heart sounds.   Pulmonary/Chest: Effort normal and breath sounds normal. No respiratory distress. He has no wheezes. He has no rales.  Abdominal: Soft. There is no tenderness.  Musculoskeletal: He exhibits no edema.  Neurological:  5/5 and equal lower extremity strength. 2+ and equal patellar reflexes bilaterally. Pt able to dorsiflex bilateral toes  and feet with good strength against resistance. Equal sensation bilaterally over thighs and lower legs.   Skin: Skin is warm and dry.  Nursing note and vitals reviewed.   ED Course  Procedures (including critical care time)  DIAGNOSTIC STUDIES: Oxygen Saturation is 97% on RA, normal by my interpretation.    COORDINATION OF CARE: 8:07 PM Discussed treatment plan with pt at bedside and pt  agreed to plan.  Labs Review Labs Reviewed - No data to display  Imaging Review No results found. I have personally reviewed and evaluated these images and lab results as part of my medical decision-making.   EKG Interpretation None      MDM   Final diagnoses:  Piriformis syndrome, right   Patient with numbness and pain in the right after sitting for prolonged period of time and sometimes when walking. Patient also reports swelling and varicose veins of the right leg. He was seen for the same a week ago. At times venous Doppler was negative for DVT. He was discharged with ibuprofen and Tylenol. Patient is coming back stating that his symptoms are not improving. Patient was seen and examined. His exam again is consistent with piriformis syndrome. I gave him 10 mg of Decadron IM here in emergency department. Will discharge home with naproxen. Instructed to follow-up with primary care doctor. At this time no evidence of cauda equina. He is afebrile. Do not think he needs any imaging on emergent basis  Filed Vitals:   01/02/16 1953  BP: 113/80  Pulse: 66  Temp: 97.6 F (36.4 C)  TempSrc: Oral  Resp: 18  SpO2: 97%   I personally performed the services described in this documentation, which was scribed in my presence. The recorded information has been reviewed and is accurate.   Jaynie Crumble, PA-C 01/02/16 2114  Gerhard Munch, MD 01/02/16 [phone]

## 2016-01-02 NOTE — ED Notes (Signed)
Pt. reports pain at right foot radiating to right ankle onset last week , denies injury or fall / ambulatory . Pain increases with movement /ambulating .

## 2016-01-02 NOTE — Discharge Instructions (Signed)
See information below. Please follow up with primary care doctor.    Piriformis Syndrome With Rehab Piriformis syndrome is a condition the affects the nervous system in the area of the hip, and is characterized by pain and possibly a loss of feeling in the backside (posterior) thigh that may extend down the entire length of the leg. The symptoms are caused by an increase in pressure on the sciatic nerve by the piriformis muscle, which is on the back of the hip and is responsible for externally rotating the hip. The sciatic nerve and its branches connect to much of the leg. Normally the sciatic nerve runs between the piriformis muscle and other muscles. However, in certain individuals the nerve runs through the muscle, which causes an increase in pressure on the nerve and results in the symptoms of piriformis syndrome. SYMPTOMS   Pain, tingling, numbness, or burning in the back of the thigh that may also extend down the entire leg.  Occasionally, tenderness in the buttock.  Loss of function of the leg.  Pain that worsens when using the piriformis muscle (running, jumping, or stairs).  Pain that increases with prolonged sitting.  Pain that is lessened by lying flat on the back. CAUSES   Piriformis syndrome is the result of an increase in pressure placed on the sciatic nerve. Oftentimes, piriformis syndrome is an overuse injury.  Stress placed on the nerve from a sudden increase in the intensity, frequency, or duration of training.  Compensation of other extremity injuries. RISK INCREASES WITH:  Sports that involve the piriformis muscle (running, walking, or jumping).  You are born with (congenital) a defect in which the sciatic nerve passes through the muscle. PREVENTION  Warm up and stretch properly before activity.  Allow for adequate recovery between workouts.  Maintain physical fitness:  Strength, flexibility, and endurance.  Cardiovascular fitness. PROGNOSIS  If treated  properly, the symptoms of piriformis syndrome usually resolve in 2 to 6 weeks. RELATED COMPLICATIONS   Persistent and possibly permanent pain and numbness in the lower extremity.  Weakness of the extremity that may progress to disability and inability to compete. TREATMENT  The most effective treatment for piriformis syndrome is rest from any activities that aggravate the symptoms. Ice and pain medication may help reduce pain and inflammation. The use of strengthening and stretching exercises may help reduce pain with activity. These exercises may be performed at home or with a therapist. A referral to a therapist may be given for further evaluation and treatment, such as ultrasound. Corticosteroid injections may be given to reduce inflammation that is causing pressure to be placed on the sciatic nerve. If nonsurgical (conservative) treatment is unsuccessful, then surgery may be recommended.  MEDICATION   If pain medication is necessary, then nonsteroidal anti-inflammatory medications, such as aspirin and ibuprofen, or other minor pain relievers, such as acetaminophen, are often recommended.  Do not take pain medication for 7 days before surgery.  Prescription pain relievers may be given if deemed necessary by your caregiver. Use only as directed and only as much as you need.  Corticosteroid injections may be given by your caregiver. These injections should be reserved for the most serious cases, because they may only be given a certain number of times. HEAT AND COLD:   Cold treatment (icing) relieves pain and reduces inflammation. Cold treatment should be applied for 10 to 15 minutes every 2 to 3 hours for inflammation and pain and immediately after any activity that aggravates your symptoms. Use ice packs  or massage the area with a piece of ice (ice massage).  Heat treatment may be used prior to performing the stretching and strengthening activities prescribed by your caregiver, physical  therapist, or athletic trainer. Use a heat pack or soak the injury in warm water. SEEK IMMEDIATE MEDICAL CARE IF:  Treatment seems to offer no benefit, or the condition worsens.  Any medications produce adverse side effects. EXERCISES RANGE OF MOTION (ROM) AND STRETCHING EXERCISES - Piriformis Syndrome These exercises may help you when beginning to rehabilitate your injury. Your symptoms may resolve with or without further involvement from your physician, physical therapist, or athletic trainer. While completing these exercises, remember:   Restoring tissue flexibility helps normal motion to return to the joints. This allows healthier, less painful movement and activity.  An effective stretch should be held for at least 30 seconds.  A stretch should never be painful. You should only feel a gentle lengthening or release in the stretched tissue. STRETCH - Hip Rotators  Lie on your back on a firm surface. Grasp your right / left knee with your right / left hand and your ankle with your opposite hand.  Keeping your hips and shoulders firmly planted, gently pull your right / left knee and rotate your lower leg toward your opposite shoulder until you feel a stretch in your buttocks.  Hold this stretch for __________ seconds. Repeat this stretch __________ times. Complete this stretch __________ times per day. STRETCH - Iliotibial Band  On the floor or bed, lie on your side so your right / left leg is on top. Bend your knee and grab your ankle.  Slowly bring your knee back so that your thigh is in line with your trunk. Keep your heel at your buttocks and gently arch your back so your head, shoulders, and hips line up.  Slowly lower your leg so that your knee approaches the floor/bed until you feel a gentle stretch on the outside of your right / left thigh. If you do not feel a stretch and your knee will not fall farther, place the heel of your opposite foot on top of your knee and pull your  thigh down farther.  Hold this stretch for __________ seconds. Repeat __________ times. Complete __________ times per day. STRENGTHENING EXERCISES - Piriformis Syndrome  These are some of the caregiver again or until your symptoms are resolved. Remember:   Strong muscles with good endurance tolerate stress better.  Do the exercises as initially prescribed by your caregiver. Progress slowly with each exercise, gradually increasing the number of repetitions and weight used under their guidance. STRENGTH - Hip Abductors, Straight Leg Raises Be aware of your form throughout the entire exercise so that you exercise the correct muscles. Sloppy form means that you are not strengthening the correct muscles.  Lie on your side so that your head, shoulders, knee, and hip line up. You may bend your lower knee to help maintain your balance. Your right / left leg should be on top.  Roll your hips slightly forward, so that your hips are stacked directly over each other and your right / left knee is facing forward.  Lift your top leg up 4-6 inches, leading with your heel. Be sure that your foot does not drift forward or that your knee does not roll toward the ceiling.  Hold this position for __________ seconds. You should feel the muscles in your outer hip lifting (you may not notice this until your leg begins to tire).  Slowly lower your leg to the starting position. Allow the muscles to fully relax before beginning the next repetition. Repeat __________ times. Complete this exercise __________ times per day.  STRENGTH - Hip Abductors, Quadruped  On a firm, lightly padded surface, position yourself on your hands and knees. Your hands should be directly below your shoulders and your knees should be directly below your hips.  Keeping your right / left knee bent, lift your leg out to the side. Keep your legs level and in line with your shoulders.  Position yourself on your hands and knees.  Hold for  __________ seconds.  Keeping your trunk steady and your hips level, slowly lower your leg to the starting position. Repeat __________ times. Complete this exercise __________ times per day.  STRENGTH - Hip Abductors, Standing  Tie one end of a rubber exercise band/tubing to a secure surface (table, pole) and tie a loop at the other end.  Place the loop around your right / left ankle. Keeping your ankle with the band directly opposite of the secured end, step away until there is tension in the tube/band.  Hold onto a chair as needed for balance.  Keeping your back upright, your shoulders over your hips, and your toes pointing forward, lift your right / left leg out to your side. Be sure to lift your leg with your hip muscles. Do not "throw" your leg or tip your body to lift your leg.  Slowly and with control, return to the starting position. Repeat exercise __________ times. Complete this exercise __________ times per day.    This information is not intended to replace advice given to you by your health care provider. Make sure you discuss any questions you have with your health care provider.   Document Released: 09/03/2005 Document Revised: 01/18/2015 Document Reviewed: 12/16/2008 Elsevier Interactive Patient Education [employer] Inc[employer]

## 2016-01-10 ENCOUNTER — Emergency Department (HOSPITAL_COMMUNITY)
Admission: EM | Admit: 2016-01-10 | Discharge: 2016-01-10 | Disposition: A | Discharge status: Home / Self Care | Payer: Medicaid Other | Attending: Emergency Medicine | Admitting: Emergency Medicine

## 2016-01-10 ENCOUNTER — Encounter (HOSPITAL_COMMUNITY): Payer: Self-pay | Admitting: Emergency Medicine

## 2016-01-10 DIAGNOSIS — G8929 Other chronic pain: Secondary | ICD-10-CM | POA: Insufficient documentation

## 2016-01-10 DIAGNOSIS — M5431 Sciatica, right side: Secondary | ICD-10-CM | POA: Diagnosis not present

## 2016-01-10 DIAGNOSIS — M79604 Pain in right leg: Secondary | ICD-10-CM | POA: Diagnosis present

## 2016-01-10 DIAGNOSIS — Z880 Allergy status to penicillin: Secondary | ICD-10-CM | POA: Diagnosis not present

## 2016-01-10 DIAGNOSIS — J45909 Unspecified asthma, uncomplicated: Secondary | ICD-10-CM | POA: Insufficient documentation

## 2016-01-10 DIAGNOSIS — Z791 Long term (current) use of non-steroidal anti-inflammatories (NSAID): Secondary | ICD-10-CM | POA: Diagnosis not present

## 2016-01-10 DIAGNOSIS — Z7952 Long term (current) use of systemic steroids: Secondary | ICD-10-CM | POA: Diagnosis not present

## 2016-01-10 DIAGNOSIS — Z79899 Other long term (current) drug therapy: Secondary | ICD-10-CM | POA: Insufficient documentation

## 2016-01-10 MED ORDER — GABAPENTIN 300 MG PO CAPS: 300.0000 mg | ORAL_CAPSULE | Freq: Three times a day (TID) | ORAL | Status: DC

## 2016-01-10 MED ORDER — NAPROXEN 250 MG PO TABS
500.0000 mg | ORAL_TABLET | Freq: Once | ORAL | Status: AC
  Administered 2016-01-10: 500 mg via ORAL
  Filled 2016-01-10: qty 2

## 2016-01-10 NOTE — ED Provider Notes (Signed)
CSN: [id number]     Arrival date & time 01/10/16  1207 History  By signing my name below, I, Soijett Blue, attest that this documentation has been prepared under the direction and in the presence of Cheri FowlerKayla Corlette Ciano, PA-C Electronically Signed: Soijett Blue, ED Scribe. 01/10/2016. 1:53 PM.   Chief Complaint  Patient presents with  . Leg Pain      The history is provided by the patient. No language interpreter was used.    Larry Shaw is a 60 y.o. male who presents to the Emergency Department complaining of chronic right leg pain onset 2 weeks. Pt states that his right leg pain starts from his right buttock and radiates down his posterior RLE. Pt notes that he hasn't been able to follow up with a PCP due to having no insurance, but he has filed for [employer] insurance. Pt denies any injury or fall. Pt ambulates with a cane at baseline. Pt is having associated symptoms of right posterior LE numbness and weakness due to pain. He notes that he has tried Rx naprosyn with no relief of his symptoms. He denies LLE numbness, color change, wound, rash, fever, chills, n/v, bowel/bladder incontinence, and any other symptoms. Pt denies IV drug use. Denies back issues.   Per pt chart review: Pt has been seen multiple times in the ED for his chronic leg pain with negative DVT study. Pt was informed to follow up PCP for further evaluation. Pt was Rx naprosyn for the relief of his symptoms.    Past Medical History  Diagnosis Date  . Asthma    Past Surgical History  Procedure Laterality Date  . Leg surgery     History reviewed. No pertinent family history. Social History  Substance Use Topics  . Smoking status: Never Smoker   . Smokeless tobacco: None  . Alcohol Use: No    Review of Systems  Constitutional: Negative for fever and chills.  Gastrointestinal:       No bowel incontinence  Genitourinary:       No bladder incontinence  Musculoskeletal: Positive for arthralgias. Negative for back pain,  joint swelling and gait problem.  Neurological: Positive for weakness (due to pain and numbness) and numbness.      Allergies  Penicillins  Home Medications   Prior to Admission medications   Medication Sig Start Date End Date Taking? Authorizing Provider  albuterol (PROVENTIL HFA;VENTOLIN HFA) 108 (90 BASE) MCG/ACT inhaler Inhale 2 puffs into the lungs every 6 (six) hours as needed for wheezing. 10/29/12   Clanford Cyndie MullL Johnson, MD  cetirizine-pseudoephedrine (ZYRTEC-D) 5-120 MG tablet Take 1 tablet by mouth 2 (two) times daily. 11/10/15   Felicie Mornavid Smith, NP  cromolyn (NASALCROM) 5.2 MG/ACT nasal spray Place 1 spray into both nostrils 4 (four) times daily. 11/10/15   Felicie Mornavid Smith, NP  fluticasone [facility](FLONASE) 50 MCG/ACT nasal spray Place 2 sprays into the nose daily. 10/29/12   Clanford Cyndie MullL Johnson, MD  gabapentin (NEURONTIN) 300 MG capsule Take 1 capsule (300 mg total) by mouth 3 (three) times daily. 01/10/16   Cheri FowlerKayla Kyriana Yankee, PA-C  naproxen (NAPROSYN) 500 MG tablet Take 1 tablet (500 mg total) by mouth 2 (two) times daily. 01/02/16   Tatyana Kirichenko, PA-C  oxyCODONE-acetaminophen (PERCOCET/ROXICET) 5-325 MG tablet Take 2 tablets by mouth every 4 (four) hours as needed for severe pain. 09/07/15   Hanna Patel-Mills, PA-C  sodium chloride (OCEAN) 0.65 % SOLN nasal spray Place 1 spray into both nostrils as needed for congestion. 11/10/15  [name], NP   BP 108/60 mmHg  Pulse 64  Temp(Src) 97.9 F (36.6 C) (Oral)  Resp 16  Ht  (1.753 m)  Wt 95.255 kg  BMI 31.00 kg/m2  SpO2 96% Physical Exam  Constitutional: He is oriented to person, place, and time. He appears well-developed and well-nourished.  HENT:  Head: Normocephalic and atraumatic.  Right Ear: External ear normal.  Left Ear: External ear normal.  Eyes: Conjunctivae are normal. No scleral icterus.  Neck: No tracheal deviation present.  Cardiovascular: Normal rate, regular rhythm, normal heart sounds and intact distal pulses.    Pulses:      Dorsalis pedis pulses are 2+ on the right side, and 2+ on the left side.  Pulmonary/Chest: Effort normal and breath sounds normal. No respiratory distress.  Abdominal: Soft. Bowel sounds are normal. He exhibits no distension. There is no tenderness.  Musculoskeletal: Normal range of motion. He exhibits tenderness.       Lumbar back: He exhibits tenderness and pain. He exhibits no bony tenderness, no swelling and no deformity.       Back:  No spinous process tenderness.  No step offs. No crepitus. TTP along right gluteus maximus. No lower extremity edema bilaterally.   Neurological: He is alert and oriented to person, place, and time.  Reflex Scores:      Patellar reflexes are 2+ on the right side and 2+ on the left side.      Achilles reflexes are 2+ on the right side and 2+ on the left side. Gait is nl with cane.  Decreased sensation in right foot dorsally to midfoot.  Normal sensation on the left. 4/5 dorsiflexion/plantar flexion of right great toe, 5/5 on the left.  4/5 strength on the RLE, 5/5 strength on the LLE.  Skin: Skin is warm and dry.  Psychiatric: He has a normal mood and affect. His behavior is normal.    ED Course  Procedures (including critical care time) DIAGNOSTIC STUDIES: Oxygen Saturation is 100% on RA, nl by my interpretation.    COORDINATION OF CARE: 1:28 PM Discussed treatment plan with pt at bedside which includes consult with attending and gabapentin and pt agreed to plan.    Labs Review Labs Reviewed - No data to display  Imaging Review No results found.    EKG Interpretation None      MDM   Final diagnoses:  Sciatica of right side   Patient presents with likely sciatica.  VSS.  No injury/trauma.  No red flags.  Intact reflexes, decreased sensation (unchanged from previous visits), and decreased strength on the left.  These findings are consistent with sciatica.  Distal pulses intact.  Antalgic gait with cane, baseline. Doubt cauda  equina.  Doubt infectious process.  Doubt AAA.  No indication for emergent imaging at this time.  Discussed importance of PCP follow up for possible outpatient imaging and further evaluation, given info for [facility]  Patient discharged home with Gabapentin.  Discussed return precautions to the ED.  Patient agrees and acknowledges the above plan for discharge.   Case has been discussed with Dr. Dalene Seltzer who agrees with the above plan for discharge.    I personally performed the services described in this documentation, which was scribed in my presence. The recorded information has been reviewed and is accurate.    Cheri Fowler, PA-C 01/10/16 1556  Alvira Monday, MD 01/10/16 2352

## 2016-01-10 NOTE — ED Notes (Signed)
Pt reports chronic right leg pain and numbness. Denies recent injury or fall. Pt ambulatory with cane. VSS.

## 2016-01-10 NOTE — ED Notes (Signed)
Patient able to ambulate independently  

## 2016-01-10 NOTE — Discharge Instructions (Signed)
Take gabapentin 3 times daily for your symptoms. Please call the [place] [facility] to establish an appointment as soon as possible to the can provide maintenance medication for you. He will also be able to schedule outpatient neurology follow-up. Please return if your pain becomes more severe, you experience increased weakness, lose control of your bowel or bladder, or are unable to walk.  Sciatica Sciatica is pain, weakness, numbness, or tingling along the path of the sciatic nerve. The nerve starts in the lower back and runs down the back of each leg. The nerve controls the muscles in the lower leg and in the back of the knee, while also providing sensation to the back of the thigh, lower leg, and the sole of your foot. Sciatica is a symptom of another medical condition. For instance, nerve damage or certain conditions, such as a herniated disk or bone spur on the spine, pinch or put pressure on the sciatic nerve. This causes the pain, weakness, or other sensations normally associated with sciatica. Generally, sciatica only affects one side of the body. CAUSES   Herniated or slipped disc.  Degenerative disk disease.  A pain disorder involving the narrow muscle in the buttocks (piriformis syndrome).  Pelvic injury or fracture.  Pregnancy.  Tumor (rare). SYMPTOMS  Symptoms can vary from mild to very severe. The symptoms usually travel from the low back to the buttocks and down the back of the leg. Symptoms can include:  Mild tingling or dull aches in the lower back, leg, or hip.  Numbness in the back of the calf or sole of the foot.  Burning sensations in the lower back, leg, or hip.  Sharp pains in the lower back, leg, or hip.  Leg weakness.  Severe back pain inhibiting movement. These symptoms may get worse with coughing, sneezing, laughing, or prolonged sitting or standing. Also, being overweight may worsen symptoms. DIAGNOSIS  Your caregiver will  perform a physical exam to look for common symptoms of sciatica. He or she may ask you to do certain movements or activities that would trigger sciatic nerve pain. Other tests may be performed to find the cause of the sciatica. These may include:  Blood tests.  X-rays.  Imaging tests, such as an MRI or CT scan. TREATMENT  Treatment is directed at the cause of the sciatic pain. Sometimes, treatment is not necessary and the pain and discomfort goes away on its own. If treatment is needed, your caregiver may suggest:  Over-the-counter medicines to relieve pain.  Prescription medicines, such as anti-inflammatory medicine, muscle relaxants, or narcotics.  Applying heat or ice to the painful area.  Steroid injections to lessen pain, irritation, and inflammation around the nerve.  Reducing activity during periods of pain.  Exercising and stretching to strengthen your abdomen and improve flexibility of your spine. Your caregiver may suggest losing weight if the extra weight makes the back pain worse.  Physical therapy.  Surgery to eliminate what is pressing or pinching the nerve, such as a bone spur or part of a herniated disk. HOME CARE INSTRUCTIONS   Only take over-the-counter or prescription medicines for pain or discomfort as directed by your caregiver.  Apply ice to the affected area for 20 minutes, 3-4 times a day for the first 48-72 hours. Then try heat in the same way.  Exercise, stretch, or perform your usual activities if these do not aggravate your pain.  Attend physical therapy sessions as directed by your caregiver.  Keep  all follow-up appointments as directed by your caregiver.  Do not wear high heels or shoes that do not provide proper support.  Check your mattress to see if it is too soft. A firm mattress may lessen your pain and discomfort. SEEK IMMEDIATE MEDICAL CARE IF:   You lose control of your bowel or bladder (incontinence).  You have increasing weakness in  the lower back, pelvis, buttocks, or legs.  You have redness or swelling of your back.  You have a burning sensation when you urinate.  You have pain that gets worse when you lie down or awakens you at night.  Your pain is worse than you have experienced in the past.  Your pain is lasting longer than 4 weeks.  You are suddenly losing weight without reason. MAKE SURE YOU:  Understand these instructions.  Will watch your condition.  Will get help right away if you are not doing well or get worse.   This information is not intended to replace advice given to you by your health care provider. Make sure you discuss any questions you have with your health care provider.   Document Released: 08/28/2001 Document Revised: 05/25/2015 Document Reviewed: 01/13/2012 Elsevier Interactive Patient Education [employer]

## 2016-01-13 ENCOUNTER — Ambulatory Visit: Payer: Medicaid Other | Attending: Physician Assistant | Admitting: Physician Assistant

## 2016-01-13 VITALS — BP 117/79 | HR 64 | Temp 98.1°F | Resp 16 | Ht 69.0 in | Wt 197.6 lb

## 2016-01-13 DIAGNOSIS — Z880 Allergy status to penicillin: Secondary | ICD-10-CM | POA: Insufficient documentation

## 2016-01-13 DIAGNOSIS — M545 Low back pain, unspecified: Secondary | ICD-10-CM | POA: Insufficient documentation

## 2016-01-13 DIAGNOSIS — M5431 Sciatica, right side: Secondary | ICD-10-CM | POA: Diagnosis not present

## 2016-01-13 DIAGNOSIS — Z79899 Other long term (current) drug therapy: Secondary | ICD-10-CM | POA: Diagnosis not present

## 2016-01-13 DIAGNOSIS — M5441 Lumbago with sciatica, right side: Secondary | ICD-10-CM | POA: Insufficient documentation

## 2016-01-13 DIAGNOSIS — J45909 Unspecified asthma, uncomplicated: Secondary | ICD-10-CM | POA: Diagnosis not present

## 2016-01-13 DIAGNOSIS — M5411 Radiculopathy, occipito-atlanto-axial region: Secondary | ICD-10-CM | POA: Diagnosis present

## 2016-01-13 MED ORDER — PREDNISONE 20 MG PO TABS: 20.0000 mg | ORAL_TABLET | Freq: Every day | ORAL | Status: DC

## 2016-01-13 MED FILL — predniSONE 20 MG TABS: 20 | 7 days supply | Qty: 7 | Fill #0

## 2016-01-13 NOTE — Progress Notes (Signed)
Patient's here for ED f/up R sciatica pain that radiates from his hip to his R foot.  Patient describes pain has numbness and painful to walk or apply pressure.

## 2016-01-13 NOTE — Progress Notes (Signed)
Larry StallionMichael Falter  VQM:[id number]  DTO:[id number]  DOB - 1955/12/13  Chief Complaint  Patient presents with  . Sciatica  . Numbness  . Back Pain       Subjective:   Larry Shaw is a 60 y.o. male here today for establishment of care. He was has been to the emergency department 3 times in the last month for pain in his back. It started 3 weeks ago. He did not have any injury to the area. He has not had any falls or accidents. He locates it to the right side of his buttock with radiation down the right lower extremity. He is having to walk with a crutch. He is numb and weak on the right side. Driving for long distances is especially difficult. His toes go numb. On one ED visit he was given Naprosyn with no improvement. On another ED visit he was given Neurontin and still has not had much improvement. He's not had any imaging studies done since 2005. He's not had any lab work recently.  ROS: GEN: denies fever or chills, denies change in weight Skin: denies lesions or rashes HEENT: denies headache, earache, epistaxis, sore throat, or neck pain EXT: + muscle spasms or swelling; + pain in lower ext on right, no weakness NEURO: denies numbness or tingling, denies sz, stroke or TIA  Problem  Back Pain At L4-L5 Level    ALLERGIES: Allergies  Allergen Reactions  . Penicillins     PAST MEDICAL HISTORY: Past Medical History  Diagnosis Date  . Asthma     PAST SURGICAL HISTORY: Past Surgical History  Procedure Laterality Date  . Leg surgery      MEDICATIONS AT HOME: Prior to Admission medications   Medication Sig Start Date End Date Taking? Authorizing Provider  albuterol (PROVENTIL HFA;VENTOLIN HFA) 108 (90 BASE) MCG/ACT inhaler Inhale 2 puffs into the lungs every 6 (six) hours as needed for wheezing. 10/29/12  Yes Clanford Cyndie MullL Johnson, MD  cetirizine-pseudoephedrine (ZYRTEC-D) 5-120 MG tablet Take 1 tablet by mouth 2 (two) times daily. 11/10/15  Yes Felicie Mornavid Smith, NP  cromolyn  (NASALCROM) 5.2 MG/ACT nasal spray Place 1 spray into both nostrils 4 (four) times daily. 11/10/15  Yes Felicie Mornavid Smith, NP  fluticasone (FLONASE) 50 MCG/ACT nasal spray Place 2 sprays into the nose daily. 10/29/12  Yes Clanford Cyndie MullL Johnson, MD  gabapentin (NEURONTIN) 300 MG capsule Take 1 capsule (300 mg total) by mouth 3 (three) times daily. 01/10/16  Yes Kayla Rose, PA-C  naproxen (NAPROSYN) 500 MG tablet Take 1 tablet (500 mg total) by mouth 2 (two) times daily. 01/02/16  Yes Tatyana Kirichenko, PA-C  oxyCODONE-acetaminophen (PERCOCET/ROXICET) 5-325 MG tablet Take 2 tablets by mouth every 4 (four) hours as needed for severe pain. 09/07/15  Yes Hanna Patel-Mills, PA-C  sodium chloride (OCEAN) 0.65 % SOLN nasal spray Place 1 spray into both nostrils as needed for congestion. 11/10/15  Yes Felicie Mornavid Smith, NP  predniSONE (DELTASONE) 20 MG tablet Take 1 tablet (20 mg total) by mouth daily with breakfast. 01/13/16   Vivianne Masteriffany S Layza Summa, PA-C     Objective:   Filed Vitals:   01/13/16 0947  BP: 117/79  Pulse: 64  Temp: 98.1 F (36.7 C)  TempSrc: Oral  Resp: 16  Height: 5\' 9"  (1.753 m)  Weight: 197 lb 9.6 oz (89.631 kg)  SpO2: 97%    Exam General appearance : Awake, alert, not in any distress. Speech Clear. Not toxic looking Neck: supple, no JVD. No cervical lymphadenopathy.  Extremities:  B/L Lower Ext shows no edema, both legs are warm to touch; varicose veins front of right leg; decrease ROM back Neurology: Awake alert, and oriented X 3, CN II-XII intact, Non focal Skin:No Rash    Assessment & Plan  1. Low back Pain with right sided sciatica  -MRI lumbar spine  -Cont Naprosyn and Neurontin  -Add Prednisone Taper  -if no improvement or abnormality on MRI, consider referral to ortho/rehab medicine etc    Return in about 1 month (around 02/12/2016). For follow up and routine health maintenance.  The patient was given clear instructions to go to ER or return to medical center if symptoms don't  improve, worsen or new problems develop. The patient verbalized understanding. The patient was told to call to get lab results if they haven't heard anything in the next week.   This note has been created with Education officer, environmental. Any transcriptional errors are unintentional.    Scot Jun, PA-C [facility] and [facility] [place], [place] [phone]   01/13/2016, 9:53 AM

## 2016-01-20 ENCOUNTER — Ambulatory Visit (HOSPITAL_COMMUNITY)
Admission: RE | Admit: 2016-01-20 | Discharge: 2016-01-20 | Disposition: A | Discharge status: Home / Self Care | Payer: Medicaid Other | Source: Ambulatory Visit | Attending: Physician Assistant | Admitting: Physician Assistant

## 2016-01-20 DIAGNOSIS — I77811 Abdominal aortic ectasia: Secondary | ICD-10-CM | POA: Diagnosis not present

## 2016-01-20 DIAGNOSIS — M5431 Sciatica, right side: Secondary | ICD-10-CM | POA: Diagnosis not present

## 2016-01-20 DIAGNOSIS — M4806 Spinal stenosis, lumbar region: Secondary | ICD-10-CM | POA: Insufficient documentation

## 2016-01-20 DIAGNOSIS — M4807 Spinal stenosis, lumbosacral region: Secondary | ICD-10-CM | POA: Diagnosis not present

## 2016-01-23 ENCOUNTER — Telehealth: Payer: Self-pay

## 2016-01-23 NOTE — Telephone Encounter (Signed)
Patient is calling to follow up on test results.Larry Shaw.Larry Shaw.Larry Shaw.Larry Shaw.please call patient

## 2016-01-24 NOTE — Addendum Note (Signed)
Addended byScot Jun: Jaxsin Bottomley S on: 01/24/2016 01:39 PM   Modules accepted: Orders

## 2016-01-24 NOTE — Telephone Encounter (Signed)
-----   Message from Vivianne Masteriffany S Noel, [place] sent at 01/24/2016  1:37 PM EDT ----- Regarding: MRI results Please let him know that there are some abnormalities on his MRI of his back and we will refer him to orthopedics. Thanks.   ----- Message -----    From: Rad Results In Interface    Sent: 01/20/2016   6:49 PM      To: Vivianne Masteriffany S Noel, PA-C

## 2016-01-24 NOTE — Telephone Encounter (Signed)
Placed call to patient, patient verified name and DOB. Patient was given MRI resutls, verbalize understanding with no further questions. Patient was transferred to front to make an f/up appt with Provider.

## 2016-02-09 ENCOUNTER — Ambulatory Visit: Payer: Self-pay | Attending: Internal Medicine

## 2016-02-28 ENCOUNTER — Encounter: Payer: Self-pay | Admitting: Internal Medicine

## 2016-02-28 ENCOUNTER — Ambulatory Visit: Payer: Medicaid Other | Attending: Internal Medicine | Admitting: Internal Medicine

## 2016-02-28 VITALS — BP 141/88 | HR 59 | Temp 97.6°F | Wt 198.2 lb

## 2016-02-28 DIAGNOSIS — M545 Low back pain, unspecified: Secondary | ICD-10-CM

## 2016-02-28 DIAGNOSIS — M5410 Radiculopathy, site unspecified: Secondary | ICD-10-CM

## 2016-02-28 DIAGNOSIS — M5126 Other intervertebral disc displacement, lumbar region: Secondary | ICD-10-CM

## 2016-02-28 MED ORDER — PREDNISONE 20 MG PO TABS: 20.0000 mg | ORAL_TABLET | Freq: Every day | ORAL | Status: DC

## 2016-02-28 MED ORDER — FLUTICASONE PROPIONATE 50 MCG/ACT NA SUSP: 2.0000 | Freq: Every day | NASAL | Status: DC

## 2016-02-28 MED ORDER — CETIRIZINE HCL 10 MG PO TABS: 10.0000 mg | ORAL_TABLET | Freq: Every day | ORAL | Status: DC

## 2016-02-28 MED ORDER — DICLOFENAC SODIUM 1 % TD GEL: 2.0000 g | Freq: Four times a day (QID) | TRANSDERMAL | Status: DC

## 2016-02-28 MED ORDER — ACETAMINOPHEN-CODEINE #3 300-30 MG PO TABS
1.0000 | ORAL_TABLET | ORAL | Status: DC | PRN
Start: 2016-02-28 — End: 2016-03-21

## 2016-02-28 MED ORDER — KETOROLAC TROMETHAMINE 30 MG/ML IJ SOLN
30.0000 mg | Freq: Once | INTRAMUSCULAR | Status: AC
  Administered 2016-02-28: 30 mg via INTRAMUSCULAR

## 2016-02-28 MED ORDER — CYCLOBENZAPRINE HCL 5 MG PO TABS: 5.0000 mg | ORAL_TABLET | Freq: Three times a day (TID) | ORAL | Status: DC | PRN

## 2016-02-28 MED FILL — VOLTAREN 1% GEL: 1 | 25 days supply | Qty: 100 | Fill #0

## 2016-02-28 MED FILL — ?CETIRIZINE HCL 10 MG TABLE: 10 | 30 days supply | Qty: 30 | Fill #0

## 2016-02-28 MED FILL — predniSONE 20 MG TABS: 20 | 8 days supply | Qty: 12 | Fill #0

## 2016-02-28 MED FILL — CYCLOBENZAPRINE 5 MG TABLET: 5 | 10 days supply | Qty: 30 | Fill #0

## 2016-02-28 MED FILL — FLUTICASONE PROP 50 MCG SPR: 50 | 30 days supply | Qty: 16 | Fill #0

## 2016-02-28 MED FILL — ACETAMINOPHEN/COD #3 TABLET: 300-30 | 5 days supply | Qty: 30 | Fill #0

## 2016-02-28 NOTE — Progress Notes (Signed)
Larry Shaw, is a 60 y.o. male  [id number]  [id number]  DOB - Jul 27, 1956  CC:  Chief Complaint  Patient presents with  . Establish Care  . Follow-up    Back/leg pain w/sciatica - right side       HPI: Larry StallionMichael Shaw is a 60 y.o. male here today to establish medical care., last seen in clinic 01/13/16 w/ PA for significant back pain w/ rle radiculopathy.  Here w/ same, 10/10 pain, out of pain meds.  MRI lumbar done on 01/20/16 found showed signifcant back disease and referral placed for ortho at time.  Has back issues for 5-6 years per pt, MRI showed prior l4-5 right laminectomy, but he does not recall when surgery occurred.  Here today w/ significant lower back pain, right >L w/ significant radiculopathy down right leg, decrease sensation on right leg as well.  Feels his leg is "swollen", walking w/ 1 crutch.  Per pt, just turned in his financial aid packet today...still pending ortho eval. Denies loss of urinary or stool incontinence.   Out of allergy meds as well, very nasally congestsed today.  Denies smoking/drinking.   Patient has No headache, No chest pain, No abdominal pain - No Nausea, No Cough - SOB.  Allergies  Allergen Reactions  . Penicillins    Past Medical History  Diagnosis Date  . Asthma    Current Outpatient Prescriptions on File Prior to Visit  Medication Sig Dispense Refill  . albuterol (PROVENTIL HFA;VENTOLIN HFA) 108 (90 BASE) MCG/ACT inhaler Inhale 2 puffs into the lungs every 6 (six) hours as needed for wheezing. (Patient not taking: Reported on 02/28/2016) 1 Inhaler 2  . cetirizine-pseudoephedrine (ZYRTEC-D) 5-120 MG tablet Take 1 tablet by mouth 2 (two) times daily. (Patient not taking: Reported on 02/28/2016) 20 tablet 0  . cromolyn (NASALCROM) 5.2 MG/ACT nasal spray Place 1 spray into both nostrils 4 (four) times daily. (Patient not taking: Reported on 02/28/2016) 26 mL 0  . fluticasone (FLONASE) 50 MCG/ACT nasal spray Place 2 sprays into the  nose daily. (Patient not taking: Reported on 02/28/2016) 16 g 2  . gabapentin (NEURONTIN) 300 MG capsule Take 1 capsule (300 mg total) by mouth 3 (three) times daily. (Patient not taking: Reported on 02/28/2016) 45 capsule 0  . naproxen (NAPROSYN) 500 MG tablet Take 1 tablet (500 mg total) by mouth 2 (two) times daily. (Patient not taking: Reported on 02/28/2016) 30 tablet 0  . oxyCODONE-acetaminophen (PERCOCET/ROXICET) 5-325 MG tablet Take 2 tablets by mouth every 4 (four) hours as needed for severe pain. (Patient not taking: Reported on 02/28/2016) 6 tablet 0  . sodium chloride (OCEAN) 0.65 % SOLN nasal spray Place 1 spray into both nostrils as needed for congestion. (Patient not taking: Reported on 02/28/2016) 60 mL 0   No current facility-administered medications on file prior to visit.   No family history on file. Social History   Social History  . Marital Status: Single    Spouse Name: N/A  . Number of Children: N/A  . Years of Education: N/A   Occupational History  . Not on file.   Social History Main Topics  . Smoking status: Never Smoker   . Smokeless tobacco: Not on file  . Alcohol Use: No  . Drug Use: No  . Sexual Activity: Not on file   Other Topics Concern  . Not on file   Social History Narrative    Review of Systems: Per HPI   Objective:   Filed Vitals:  02/28/16 1048  BP: 141/88  Pulse: 59  Temp: 97.6 F (36.4 C)    Filed Weights   02/28/16 1048  Weight: 198 lb 3.2 oz (89.903 kg)    BP Readings from Last 3 Encounters:  02/28/16 141/88  01/13/16 117/79  01/10/16 108/60    Physical Exam: Constitutional: Patient appears well-developed and well-nourished.  AAOx3, in distress due to pain, splinting on right le./back HENT: Normocephalic, atraumatic, External right and left ear normal. Oropharynx is clear and moist.  Eyes: Conjunctivae and EOM are normal. PERRL, no scleral icterus. Neck: Normal ROM. Neck supple. No JVD.  CVS: RRR, S1/S2 +, no  murmurs, no gallops, no carotid bruit.  Pulmonary: Effort and breath sounds normal, no stridor, rhonchi, wheezes, rales.  Abdominal: Soft. BS +, no distension, nttp Musculoskeletal: rom limited on rle due to pain.  ttp bilateral lower l4-s region, right > left w/ reproducible radiculopathy to rle.    LE: bilat/ no c/c/e, pulses 2+ bilateral.    +varicose veins bilaterally. Neg homan's sign. Neuro: Alert. Normal reflexes, muscle tone coordination. No cranial nerve deficit grossly. Skin: Skin is warm and dry. No rash noted. Not diaphoretic. No erythema. No pallor. Psychiatric: Normal mood and affect. Behavior, judgment, thought content normal.  Lab Results  Component Value Date   WBC 9.2 10/29/2012   HGB 16.0 10/29/2012   HCT 46.1 10/29/2012   MCV 88.1 10/29/2012   PLT 276 10/29/2012   Lab Results  Component Value Date   CREATININE 1.17 10/29/2012   BUN 13 10/29/2012   NA 139 10/29/2012   K 4.5 10/29/2012   CL 100 10/29/2012   CO2 28 10/29/2012    No results found for: HGBA1C Lipid Panel  No results found for: CHOL, TRIG, HDL, CHOLHDL, VLDL, LDLCALC     Depression screen [facility] 2/9 02/28/2016  Decreased Interest 0  Down, Depressed, Hopeless 0  PHQ - 2 Score 0  Altered sleeping 0  Tired, decreased energy 0  Change in appetite 0  Feeling bad or failure about yourself  0  Trouble concentrating 0  Moving slowly or fidgety/restless 0  Suicidal thoughts 0  PHQ-9 Score 0  Difficult doing work/chores Not difficult at all   Mri 01/20/16 IMPRESSION: L3-4 multifactorial mild bilateral foraminal narrowing without nerve root compression. Mild spinal stenosis. Mild to moderate right-sided and mild left-sided lateral recess stenosis.  L4-5 prior right laminectomy. Multifactorial moderate right-sided and mild to moderate left-sided foraminal narrowing with encroachment upon the exiting L4 nerve roots greater on the right. No significant spinal stenosis. Left paracentral spur.  Moderate left-sided lateral recess stenosis. Scar suspected in the right lateral recess.  L5-S1 bulge and spur slightly greater to left with mild S1 nerve root contact greater on the left. Minimal foraminal narrowing greater on the left.  Atherosclerotic changes abdominal aorta with mild ectasia. Bulge just above the bifurcation where aorta measures up to 2.7 cm and just above this level 2.2 cm. Slight ectasia common iliac arteries.  Assessment and plan:   1. Back pain at L4-L5 level and l5-s1, w/ radiculopathy to RLL -flexeril  prn #30 - tylenol #3 prn, #30 - ketorolac (TORADOL) 30 MG/ML injection 30 mg; Inject 1 mL (30 mg total) into the muscle once. -volterin gel prn. - ortho pending  2. Lumbar herniated disc,  Noted on mri - ortho referral pending financial aid  3. Seasonal allergies w/ congestion - renewed flonase, zyrtec rx given as well  4. Financial aid Pt limited reading, will need  help w/ financial assistance packet.  Return in about 4 weeks (around 03/27/2016), or if symptoms worsen or fail to improve, for back pain/.  The patient was given clear instructions to go to ER or return to medical center if symptoms don't improve, worsen or new problems develop. The patient verbalized understanding. The patient was told to call to get lab results if they haven't heard anything in the next week.      Pete Glatter, MD, MBA/MHA [facility] [place], [place] [phone]   02/28/2016, 11:18 AM

## 2016-02-28 NOTE — Patient Instructions (Addendum)
chk w/ Financial services for status, needs ortho eval asap.  Back Pain, Adult Back pain is very common. The pain often gets better over time. The cause of back pain is usually not dangerous. Most people can learn to manage their back pain on their own.  HOME CARE  Watch your back pain for any changes. The following actions may help to lessen any pain you are feeling: 1. Stay active. Start with short walks on flat ground if you can. Try to walk farther each day. 2. Exercise regularly as told by your doctor. Exercise helps your back heal faster. It also helps avoid future injury by keeping your muscles strong and flexible. 3. Do not sit, drive, or stand in one place for more than 30 minutes. 4. Do not stay in bed. Resting more than 1-2 days can slow down your recovery. 5. Be careful when you bend or lift an object. Use good form when lifting: 1. Bend at your knees. 2. Keep the object close to your body. 3. Do not twist. 6. Sleep on a firm mattress. Lie on your side, and bend your knees. If you lie on your back, put a pillow under your knees. 7. Take medicines only as told by your doctor. 8. Put ice on the injured area. 1. Put ice in a plastic bag. 2. Place a towel between your skin and the bag. 3. Leave the ice on for 20 minutes, 2-3 times a day for the first 2-3 days. After that, you can switch between ice and heat packs. 9. Avoid feeling anxious or stressed. Find good ways to deal with stress, such as exercise. 10. Maintain a healthy weight. Extra weight puts stress on your back. GET HELP IF:  1. You have pain that does not go away with rest or medicine. 2. You have worsening pain that goes down into your legs or buttocks. 3. You have pain that does not get better in one week. 4. You have pain at night. 5. You lose weight. 6. You have a fever or chills. GET HELP RIGHT AWAY IF:  1. You cannot control when you poop (bowel movement) or pee (urinate). 2. Your arms or legs feel  weak. 3. Your arms or legs lose feeling (numbness). 4. You feel sick to your stomach (nauseous) or throw up (vomit). 5. You have belly (abdominal) pain. 6. You feel like you may pass out (faint).   This information is not intended to replace advice given to you by your health care provider. Make sure you discuss any questions you have with your health care provider.   Document Released: 02/20/2008 Document Revised: 09/24/2014 Document Reviewed: 01/05/2014 Elsevier Interactive Patient Education 2016 Elsevier Inc.  - Sciatica Sciatica is pain, weakness, numbness, or tingling along your sciatic nerve. The nerve starts in the lower back and runs down the back of each leg. Nerve damage or certain conditions pinch or put pressure on the sciatic nerve. This causes the pain, weakness, and other discomforts of sciatica. HOME CARE  11. Only take medicine as told by your doctor. 12. Apply ice to the affected area for 20 minutes. Do this 3-4 times a day for the first 48-72 hours. Then try heat in the same way. 13. Exercise, stretch, or do your usual activities if these do not make your pain worse. 14. Go to physical therapy as told by your doctor. 15. Keep all doctor visits as told. 16. Do not wear high heels or shoes that are not supportive. 17.  Get a firm mattress if your mattress is too soft to lessen pain and discomfort. GET HELP RIGHT AWAY IF:  7. You cannot control when you poop (bowel movement) or pee (urinate). 8. You have more weakness in your lower back, lower belly (pelvis), butt (buttocks), or legs. 9. You have redness or puffiness (swelling) of your back. 10. You have a burning feeling when you pee. 11. You have pain that gets worse when you lie down. 12. You have pain that wakes you from your sleep. 13. Your pain is worse than past pain. 14. Your pain lasts longer than 4 weeks. 15. You are suddenly losing weight without reason. MAKE SURE YOU:  7. Understand these  instructions. 8. Will watch this condition. 9. Will get help right away if you are not doing well or get worse.   This information is not intended to replace advice given to you by your health care provider. Make sure you discuss any questions you have with your health care provider.   Document Released: 06/12/2008 Document Revised: 05/25/2015 Document Reviewed: 01/13/2012 Elsevier Interactive Patient Education 2016 Elsevier Inc.  - - Back Exercises If you have pain in your back, do these exercises 2-3 times each day or as told by your doctor. When the pain goes away, do the exercises once each day, but repeat the steps more times for each exercise (do more repetitions). If you do not have pain in your back, do these exercises once each day or as told by your doctor. EXERCISES Single Knee to Chest Do these steps 3-5 times in a row for each leg: 18. Lie on your back on a firm bed or the floor with your legs stretched out. 19. Bring one knee to your chest. 20. Hold your knee to your chest by grabbing your knee or thigh. 21. Pull on your knee until you feel a gentle stretch in your lower back. 22. Keep doing the stretch for 10-30 seconds. 23. Slowly let go of your leg and straighten it. Pelvic Tilt Do these steps 5-10 times in a row: 16. Lie on your back on a firm bed or the floor with your legs stretched out. 17. Bend your knees so they point up to the ceiling. Your feet should be flat on the floor. 18. Tighten your lower belly (abdomen) muscles to press your lower back against the floor. This will make your tailbone point up to the ceiling instead of pointing down to your feet or the floor. 19. Stay in this position for 5-10 seconds while you gently tighten your muscles and breathe evenly. Cat-Cow Do these steps until your lower back bends more easily: 10. Get on your hands and knees on a firm surface. Keep your hands under your shoulders, and keep your knees under your hips. You may  put padding under your knees. 11. Let your head hang down, and make your tailbone point down to the floor so your lower back is round like the back of a cat. 12. Stay in this position for 5 seconds. 13. Slowly lift your head and make your tailbone point up to the ceiling so your back hangs low (sags) like the back of a cow. 14. Stay in this position for 5 seconds. Press-Ups Do these steps 5-10 times in a row: 1. Lie on your belly (face-down) on the floor. 2. Place your hands near your head, about shoulder-width apart. 3. While you keep your back relaxed and keep your hips on the floor, slowly straighten  your arms to raise the top half of your body and lift your shoulders. Do not use your back muscles. To make yourself more comfortable, you may change where you place your hands. 4. Stay in this position for 5 seconds. 5. Slowly return to lying flat on the floor. Bridges Do these steps 10 times in a row: 1. Lie on your back on a firm surface. 2. Bend your knees so they point up to the ceiling. Your feet should be flat on the floor. 3. Tighten your butt muscles and lift your butt off of the floor until your waist is almost as high as your knees. If you do not feel the muscles working in your butt and the back of your thighs, slide your feet 1-2 inches farther away from your butt. 4. Stay in this position for 3-5 seconds. 5. Slowly lower your butt to the floor, and let your butt muscles relax. If this exercise is too easy, try doing it with your arms crossed over your chest. Belly Crunches Do these steps 5-10 times in a row: 1. Lie on your back on a firm bed or the floor with your legs stretched out. 2. Bend your knees so they point up to the ceiling. Your feet should be flat on the floor. 3. Cross your arms over your chest. 4. Tip your chin a little bit toward your chest but do not bend your neck. 5. Tighten your belly muscles and slowly raise your chest just enough to lift your shoulder  blades a tiny bit off of the floor. 6. Slowly lower your chest and your head to the floor. Back Lifts Do these steps 5-10 times in a row: 1. Lie on your belly (face-down) with your arms at your sides, and rest your forehead on the floor. 2. Tighten the muscles in your legs and your butt. 3. Slowly lift your chest off of the floor while you keep your hips on the floor. Keep the back of your head in line with the curve in your back. Look at the floor while you do this. 4. Stay in this position for 3-5 seconds. 5. Slowly lower your chest and your face to the floor. GET HELP IF:  Your back pain gets a lot worse when you do an exercise.  Your back pain does not lessen 2 hours after you exercise. If you have any of these problems, stop doing the exercises. Do not do them again unless your doctor says it is okay. GET HELP RIGHT AWAY IF:  You have sudden, very bad back pain. If this happens, stop doing the exercises. Do not do them again unless your doctor says it is okay.   This information is not intended to replace advice given to you by your health care provider. Make sure you discuss any questions you have with your health care provider.   Document Released: 10/06/2010 Document Revised: 05/25/2015 Document Reviewed: 10/28/2014 Elsevier Interactive Patient Education [employer]

## 2016-03-12 ENCOUNTER — Telehealth: Payer: Self-pay | Admitting: Internal Medicine

## 2016-03-12 NOTE — Telephone Encounter (Signed)
Pt. Called to let his PCP know that his medicaid will be active in 10 days.

## 2016-03-21 ENCOUNTER — Encounter: Payer: Self-pay | Admitting: Internal Medicine

## 2016-03-21 ENCOUNTER — Ambulatory Visit: Payer: Medicaid Other | Attending: Internal Medicine | Admitting: Internal Medicine

## 2016-03-21 VITALS — BP 115/73 | HR 67 | Temp 97.8°F | Resp 16 | Ht 71.0 in | Wt 204.6 lb

## 2016-03-21 DIAGNOSIS — M5126 Other intervertebral disc displacement, lumbar region: Secondary | ICD-10-CM | POA: Diagnosis not present

## 2016-03-21 DIAGNOSIS — M545 Low back pain, unspecified: Secondary | ICD-10-CM

## 2016-03-21 MED ORDER — ACETAMINOPHEN-CODEINE #3 300-30 MG PO TABS
1.0000 | ORAL_TABLET | ORAL | Status: AC | PRN
Start: 2016-03-21 — End: ?

## 2016-03-21 MED ORDER — TRAMADOL HCL 50 MG PO TABS: 50.0000 mg | ORAL_TABLET | Freq: Four times a day (QID) | ORAL | Status: DC | PRN

## 2016-03-21 MED ORDER — METHOCARBAMOL 750 MG PO TABS: 750.0000 mg | ORAL_TABLET | Freq: Four times a day (QID) | ORAL | Status: DC | PRN

## 2016-03-21 MED ORDER — KETOROLAC TROMETHAMINE 30 MG/ML IJ SOLN
30.0000 mg | Freq: Once | INTRAMUSCULAR | Status: AC
  Administered 2016-03-21: 30 mg via INTRAMUSCULAR

## 2016-03-21 MED FILL — METHOCARBAMOL 750 MG TABLET: 750 | 11 days supply | Qty: 45 | Fill #0

## 2016-03-21 MED FILL — ACETAMINOPHEN/COD #3 TABLET: 300-30 | 10 days supply | Qty: 60 | Fill #0

## 2016-03-21 MED FILL — traMADol HCL 50 MG TABS: 50 | 11 days supply | Qty: 45 | Fill #0

## 2016-03-21 NOTE — Progress Notes (Signed)
Larry StallionMichael Kohlbeck, is a 60 y.o. male  ZOX:[id number]:[id number]  WJX:[id number]  DOB - October 02, 1955  Chief Complaint  Patient presents with  . Back Pain        Subjective:   Larry StallionMichael Phetteplace is a 60 y.o. male here today for a follow up visit of lower back pain w/ radiculopathy down right leg. Pain very severe, limping w/ cane. Tylenol #3 /ibuprofen and flexeril 5 combo did not help at all. Pain 10/10. toradol injection did help somewhat last time.  Denies urinary/stool incontinence. Now has Medicaid. Does not smoke or drink.   Patient has No headache, No chest pain, No abdominal pain - No Nausea, No new weakness tingling or numbness, No Cough - SOB.  No problems updated.  ALLERGIES: Allergies  Allergen Reactions  . Penicillins     PAST MEDICAL HISTORY: Past Medical History  Diagnosis Date  . Asthma     MEDICATIONS AT HOME: Prior to Admission medications   Medication Sig Start Date End Date Taking? Authorizing Provider  acetaminophen-codeine (TYLENOL #3) 300-30 MG tablet Take 1 tablet by mouth every 4 (four) hours as needed for moderate pain. 03/21/16  Yes Lateshia Schmoker Marland Mcalpine Zaila Crew, MD  albuterol (PROVENTIL HFA;VENTOLIN HFA) 108 (90 BASE) MCG/ACT inhaler Inhale 2 puffs into the lungs every 6 (six) hours as needed for wheezing. 10/29/12  Yes Clanford Cyndie MullL Johnson, MD  cetirizine (ZYRTEC) 10 MG tablet Take 1 tablet (10 mg total) by mouth daily. 02/28/16  Yes Pete Glatterawn T Joziyah Roblero, MD  cromolyn (NASALCROM) 5.2 MG/ACT nasal spray Place 1 spray into both nostrils 4 (four) times daily. 11/10/15  Yes Felicie Mornavid Smith, NP  diclofenac sodium (VOLTAREN) 1 % GEL Apply 2 g topically 4 (four) times daily. 02/28/16  Yes Pete Glatterawn T Rikia Sukhu, MD  fluticasone (FLONASE) 50 MCG/ACT nasal spray Place 2 sprays into both nostrils daily. 02/28/16  Yes Pete Glatterawn T Shaquoya Cosper, MD  gabapentin (NEURONTIN) 300 MG capsule Take 1 capsule (300 mg total) by mouth 3 (three) times daily. 01/10/16  Yes Cheri FowlerKayla Rose, PA-C  oxyCODONE-acetaminophen  (PERCOCET/ROXICET) 5-325 MG tablet Take 2 tablets by mouth every 4 (four) hours as needed for severe pain. 09/07/15  Yes Hanna Patel-Mills, PA-C  cetirizine-pseudoephedrine (ZYRTEC-D) 5-120 MG tablet Take 1 tablet by mouth 2 (two) times daily. Patient not taking: Reported on 02/28/2016 11/10/15   Felicie Mornavid Smith, NP  methocarbamol (ROBAXIN-750) 750 MG tablet Take 1 tablet (750 mg total) by mouth every 6 (six) hours as needed for muscle spasms. 03/21/16   Pete Glatterawn T Binnie Droessler, MD  predniSONE (DELTASONE) 20 MG tablet Take 1 tablet (20 mg total) by mouth daily with breakfast. Day 1-4 take 2 tabs (=40mg ) daily in AM, day 5-8 take 1 tab (=20mg ) daily in AM, than stop Patient not taking: Reported on 03/21/2016 02/28/16   Pete Glatterawn T Rae Plotner, MD  sodium chloride (OCEAN) 0.65 % SOLN nasal spray Place 1 spray into both nostrils as needed for congestion. Patient not taking: Reported on 02/28/2016 11/10/15   Felicie Mornavid Smith, NP  traMADol (ULTRAM) 50 MG tablet Take 1 tablet (50 mg total) by mouth every 6 (six) hours as needed. 03/21/16   Pete Glatterawn T Chaylee Ehrsam, MD     Objective:   Filed Vitals:   03/21/16 1019  BP: 115/73  Pulse: 67  Temp: 97.8 F (36.6 C)  TempSrc: Oral  Resp: 16  Height: 5\' 11"  (1.803 m)  Weight: 204 lb 9.6 oz (92.806 kg)  SpO2: 95%    Exam General appearance : Awake, alert, not in any distress. Speech  Clear. Not toxic looking, uncomfortable appearing.  Flushed appearance. HEENT: Atraumatic and Normocephalic, Neck: supple, no JVD. Chest:Good air entry bilaterally, no added sounds. CVS: S1 S2 regular, no murmurs/gallups or rubs. Abdomen: Bowel sounds active, Non tender Extremities:  Increase passive venous vascular congestion rle/more pronounced varicose veins, rle calve muscle more defined.   ttp right lower back, +straight leg test rle. Neurology: Awake alert, and oriented X 3, CN II-XII grossly intact, Non focal Skin:No Rash  Data Review No results found for: HGBA1C    Mri 01/20/16  lumbar: IMPRESSION: L3-4 multifactorial mild bilateral foraminal narrowing without nerve root compression. Mild spinal stenosis. Mild to moderate right-sided and mild left-sided lateral recess stenosis.  L4-5 prior right laminectomy. Multifactorial moderate right-sided and mild to moderate left-sided foraminal narrowing with encroachment upon the exiting L4 nerve roots greater on the right. No significant spinal stenosis. Left paracentral spur. Moderate left-sided lateral recess stenosis. Scar suspected in the right lateral recess.  L5-S1 bulge and spur slightly greater to left with mild S1 nerve root contact greater on the left. Minimal foraminal narrowing greater on the left.  Atherosclerotic changes abdominal aorta with mild ectasia. Bulge just above the bifurcation where aorta measures up to 2.7 cm and just above this level 2.2 cm. Slight ectasia common iliac arteries.   [facility] 2/9 03/21/2016 02/28/2016  Decreased Interest 1 0  Down, Depressed, Hopeless 2 0  PHQ - 2 Score 3 0  Altered sleeping 3 0  Tired, decreased energy 3 0  Change in appetite 0 0  Feeling bad or failure about yourself  0 0  Trouble concentrating 0 0  Moving slowly or fidgety/restless 0 0  Suicidal thoughts 0 0  PHQ-9 Score 9 0  Difficult doing work/chores - Not difficult at all      Assessment & Plan   1. Lumbar pain with radiation down right leg - rx; tylenol #3, ultram and robaxin prn - ketorolac (TORADOL) 30 MG/ML injection 30 mg; Inject 1 mL (30 mg total) into the muscle once. - Ambulatory referral to Orthopedic Surgery, now has Medicaid. - pain contract signed today, will void if stronger narcotics prescribed at Ortho.    Patient have been counseled extensively about nutrition and exercise  Return in about 4 weeks (around 04/18/2016).  The patient was given clear instructions to go to ER or return to medical center if symptoms don't improve, worsen or new problems  develop. The patient verbalized understanding. The patient was told to call to get lab results if they haven't heard anything in the next week.   This note has been created with Education officer, environmentalDragon speech recognition software and smart phrase technology. Any transcriptional errors are unintentional.   Pete Glatterawn T Zamarian Scarano, MD, MBA/MHA [facility] [place], [place] [phone]   03/21/2016, 11:17 AM

## 2016-03-21 NOTE — Patient Instructions (Signed)
Back Pain, Adult °Back pain is very common. The pain often gets better over time. The cause of back pain is usually not dangerous. Most people can learn to manage their back pain on their own.  °HOME CARE  °Watch your back pain for any changes. The following actions may help to lessen any pain you are feeling: °1. Stay active. Start with short walks on flat ground if you can. Try to walk farther each day. °2. Exercise regularly as told by your doctor. Exercise helps your back heal faster. It also helps avoid future injury by keeping your muscles strong and flexible. °3. Do not sit, drive, or stand in one place for more than 30 minutes. °4. Do not stay in bed. Resting more than 1-2 days can slow down your recovery. °5. Be careful when you bend or lift an object. Use good form when lifting: °1. Bend at your knees. °2. Keep the object close to your body. °3. Do not twist. °6. Sleep on a firm mattress. Lie on your side, and bend your knees. If you lie on your back, put a pillow under your knees. °7. Take medicines only as told by your doctor. °8. Put ice on the injured area. °1. Put ice in a plastic bag. °2. Place a towel between your skin and the bag. °3. Leave the ice on for 20 minutes, 2-3 times a day for the first 2-3 days. After that, you can switch between ice and heat packs. °9. Avoid feeling anxious or stressed. Find good ways to deal with stress, such as exercise. °10. Maintain a healthy weight. Extra weight puts stress on your back. °GET HELP IF:  °1. You have pain that does not go away with rest or medicine. °2. You have worsening pain that goes down into your legs or buttocks. °3. You have pain that does not get better in one week. °4. You have pain at night. °5. You lose weight. °6. You have a fever or chills. °GET HELP RIGHT AWAY IF:  °1. You cannot control when you poop (bowel movement) or pee (urinate). °2. Your arms or legs feel weak. °3. Your arms or legs lose feeling (numbness). °4. You feel sick to  your stomach (nauseous) or throw up (vomit). °5. You have belly (abdominal) pain. °6. You feel like you may pass out (faint). °  °This information is not intended to replace advice given to you by your health care provider. Make sure you discuss any questions you have with your health care provider. °  °Document Released: 02/20/2008 Document Revised: 09/24/2014 Document Reviewed: 01/05/2014 °Elsevier Interactive Patient Education ©2016 Elsevier Inc. ° °Back Exercises °If you have pain in your back, do these exercises 2-3 times each day or as told by your doctor. When the pain goes away, do the exercises once each day, but repeat the steps more times for each exercise (do more repetitions). If you do not have pain in your back, do these exercises once each day or as told by your doctor. °EXERCISES °Single Knee to Chest °Do these steps 3-5 times in a row for each leg: °11. Lie on your back on a firm bed or the floor with your legs stretched out. °12. Bring one knee to your chest. °13. Hold your knee to your chest by grabbing your knee or thigh. °14. Pull on your knee until you feel a gentle stretch in your lower back. °15. Keep doing the stretch for 10-30 seconds. °16. Slowly let go of your   leg and straighten it. °Pelvic Tilt °Do these steps 5-10 times in a row: °7. Lie on your back on a firm bed or the floor with your legs stretched out. °8. Bend your knees so they point up to the ceiling. Your feet should be flat on the floor. °9. Tighten your lower belly (abdomen) muscles to press your lower back against the floor. This will make your tailbone point up to the ceiling instead of pointing down to your feet or the floor. °10. Stay in this position for 5-10 seconds while you gently tighten your muscles and breathe evenly. °Cat-Cow °Do these steps until your lower back bends more easily: °7. Get on your hands and knees on a firm surface. Keep your hands under your shoulders, and keep your knees under your hips. You may  put padding under your knees. °8. Let your head hang down, and make your tailbone point down to the floor so your lower back is round like the back of a cat. °9. Stay in this position for 5 seconds. °10. Slowly lift your head and make your tailbone point up to the ceiling so your back hangs low (sags) like the back of a cow. °11. Stay in this position for 5 seconds. °Press-Ups °Do these steps 5-10 times in a row: °1. Lie on your belly (face-down) on the floor. °2. Place your hands near your head, about shoulder-width apart. °3. While you keep your back relaxed and keep your hips on the floor, slowly straighten your arms to raise the top half of your body and lift your shoulders. Do not use your back muscles. To make yourself more comfortable, you may change where you place your hands. °4. Stay in this position for 5 seconds. °5. Slowly return to lying flat on the floor. °Bridges °Do these steps 10 times in a row: °1. Lie on your back on a firm surface. °2. Bend your knees so they point up to the ceiling. Your feet should be flat on the floor. °3. Tighten your butt muscles and lift your butt off of the floor until your waist is almost as high as your knees. If you do not feel the muscles working in your butt and the back of your thighs, slide your feet 1-2 inches farther away from your butt. °4. Stay in this position for 3-5 seconds. °5. Slowly lower your butt to the floor, and let your butt muscles relax. °If this exercise is too easy, try doing it with your arms crossed over your chest. °Belly Crunches °Do these steps 5-10 times in a row: °1. Lie on your back on a firm bed or the floor with your legs stretched out. °2. Bend your knees so they point up to the ceiling. Your feet should be flat on the floor. °3. Cross your arms over your chest. °4. Tip your chin a little bit toward your chest but do not bend your neck. °5. Tighten your belly muscles and slowly raise your chest just enough to lift your shoulder blades  a tiny bit off of the floor. °6. Slowly lower your chest and your head to the floor. °Back Lifts °Do these steps 5-10 times in a row: °1. Lie on your belly (face-down) with your arms at your sides, and rest your forehead on the floor. °2. Tighten the muscles in your legs and your butt. °3. Slowly lift your chest off of the floor while you keep your hips on the floor. Keep the back of your head   in line with the curve in your back. Look at the floor while you do this. °4. Stay in this position for 3-5 seconds. °5. Slowly lower your chest and your face to the floor. °GET HELP IF: °· Your back pain gets a lot worse when you do an exercise. °· Your back pain does not lessen 2 hours after you exercise. °If you have any of these problems, stop doing the exercises. Do not do them again unless your doctor says it is okay. °GET HELP RIGHT AWAY IF: °· You have sudden, very bad back pain. If this happens, stop doing the exercises. Do not do them again unless your doctor says it is okay. °  °This information is not intended to replace advice given to you by your health care provider. Make sure you discuss any questions you have with your health care provider. °  °Document Released: 10/06/2010 Document Revised: 05/25/2015 Document Reviewed: 10/28/2014 °Elsevier Interactive Patient Education ©2016 Elsevier Inc. ° °

## 2016-03-26 ENCOUNTER — Telehealth: Payer: Self-pay | Admitting: Internal Medicine

## 2016-03-26 NOTE — Telephone Encounter (Signed)
Talked to ortho appt/ [phone], they state cannot make appt w/ pt until he calls them back. They were unable to leave VM.  I called pt, verified his dob.  He states he has their NMbr and will call them. He stated that he may owe them some $ so they will not be able to do surgery until that is paid off.

## 2016-05-01 ENCOUNTER — Encounter: Payer: Self-pay | Admitting: Internal Medicine

## 2016-05-01 ENCOUNTER — Ambulatory Visit: Payer: Medicaid Other | Attending: Internal Medicine | Admitting: Internal Medicine

## 2016-05-01 VITALS — BP 113/74 | HR 67 | Temp 97.7°F | Resp 16 | Wt 199.0 lb

## 2016-05-01 DIAGNOSIS — Z880 Allergy status to penicillin: Secondary | ICD-10-CM | POA: Insufficient documentation

## 2016-05-01 DIAGNOSIS — M5116 Intervertebral disc disorders with radiculopathy, lumbar region: Secondary | ICD-10-CM | POA: Insufficient documentation

## 2016-05-01 DIAGNOSIS — M5126 Other intervertebral disc displacement, lumbar region: Secondary | ICD-10-CM | POA: Diagnosis not present

## 2016-05-01 DIAGNOSIS — M545 Low back pain, unspecified: Secondary | ICD-10-CM

## 2016-05-01 DIAGNOSIS — Z79899 Other long term (current) drug therapy: Secondary | ICD-10-CM | POA: Insufficient documentation

## 2016-05-01 DIAGNOSIS — J45909 Unspecified asthma, uncomplicated: Secondary | ICD-10-CM | POA: Insufficient documentation

## 2016-05-01 DIAGNOSIS — M79606 Pain in leg, unspecified: Secondary | ICD-10-CM | POA: Diagnosis present

## 2016-05-01 MED ORDER — TRAMADOL HCL 50 MG PO TABS: 50.0000 mg | ORAL_TABLET | Freq: Four times a day (QID) | ORAL | 0 refills | Status: AC | PRN

## 2016-05-01 NOTE — Progress Notes (Signed)
Larry StallionMichael Traister, is a 60 y.o. male  ZOX:[id number]:[id number]  WJX:[id number]  DOB - [birth year]  Chief Complaint  Patient presents with  . Leg Pain        Subjective:   Larry Shaw is a 60 y.o. male here today for a follow up visit and request to transfer care to new pcp.  Pt here to request help sending clinic records, etc to his new PCP in [place]. Pt use to see this doctor in past.  He was able to talk to family practice doctor, who will be able to get him referral to Ortho in [place].  Per pt, he [place], and needs to see out of town doctor.  He needs refill of his ultram as well.  Patient has No headache, No chest pain, No abdominal pain - No Nausea, No new weakness tingling or numbness, No Cough - SOB.  No problems updated.  ALLERGIES: Allergies  Allergen Reactions  . Penicillins     PAST MEDICAL HISTORY: Past Medical History:  Diagnosis Date  . Asthma     MEDICATIONS AT HOME: Prior to Admission medications   Medication Sig Start Date End Date Taking? Authorizing Provider  acetaminophen-codeine (TYLENOL #3) 300-30 MG tablet Take 1 tablet by mouth every 4 (four) hours as needed for moderate pain. 03/21/16  Yes Oluwatomisin Hustead Marland Mcalpine Frady Taddeo, MD  albuterol (PROVENTIL HFA;VENTOLIN HFA) 108 (90 BASE) MCG/ACT inhaler Inhale 2 puffs into the lungs every 6 (six) hours as needed for wheezing. 10/29/12  Yes Clanford Cyndie MullL Johnson, MD  diclofenac sodium (VOLTAREN) 1 % GEL Apply 2 g topically 4 (four) times daily. 02/28/16  Yes Pete Glatterawn T Jaretzi Droz, MD  methocarbamol (ROBAXIN-750) 750 MG tablet Take 1 tablet (750 mg total) by mouth every 6 (six) hours as needed for muscle spasms. 03/21/16  Yes Pete Glatterawn T Machele Deihl, MD  sodium chloride (OCEAN) 0.65 % SOLN nasal spray Place 1 spray into both nostrils as needed for congestion. 11/10/15  Yes Felicie Mornavid Smith, NP  traMADol (ULTRAM) 50 MG tablet Take 1 tablet (50 mg total) by mouth every 6 (six) hours as  needed. 05/01/16  Yes Pete Glatterawn T Ravon Mortellaro, MD  cetirizine (ZYRTEC) 10 MG tablet Take 1 tablet (10 mg total) by mouth daily. Patient not taking: Reported on 05/01/2016 02/28/16   Pete Glatterawn T [place] Folden, MD  cetirizine-pseudoephedrine (ZYRTEC-D) 5-120 MG tablet Take 1 tablet by mouth 2 (two) times daily. Patient not taking: Reported on 02/28/2016 11/10/15   Felicie Mornavid Smith, NP  cromolyn (NASALCROM) 5.2 MG/ACT nasal spray Place 1 spray into both nostrils 4 (four) times daily. Patient not taking: Reported on 05/01/2016 11/10/15   Felicie Mornavid Smith, NP  fluticasone [facility](FLONASE) 50 MCG/ACT nasal spray Place 2 sprays into both nostrils daily. Patient not taking: Reported on 05/01/2016 02/28/16   Pete Glatterawn T Nocholas Damaso, MD  gabapentin (NEURONTIN) 300 MG capsule Take 1 capsule (300 mg total) by mouth 3 (three) times daily. Patient not taking: Reported on 05/01/2016 01/10/16   Cheri FowlerKayla Rose, PA-C  oxyCODONE-acetaminophen (PERCOCET/ROXICET) 5-325 MG tablet Take 2 tablets by mouth every 4 (four) hours as needed for severe pain. Patient not taking: Reported on 05/01/2016 09/07/15   Catha GosselinHanna Patel-Mills, PA-C  predniSONE (DELTASONE) 20 MG tablet Take 1 tablet (20 mg total) by mouth daily with breakfast. Day 1-4 take 2 tabs (=40mg ) daily in AM, day 5-8 take 1 tab (=20mg ) daily in AM, than stop Patient not taking: Reported on 03/21/2016 02/28/16   Pete Glatterawn T Loleta Frommelt,  MD     Objective:   Vitals:   05/01/16 1548  BP: 113/74  Pulse: 67  Resp: 16  Temp: 97.7 F (36.5 C)  TempSrc: Oral  SpO2: 95%  Weight: 199 lb (90.3 kg)    Exam General appearance : Awake, alert, not in any distress. Speech Clear. Not toxic looking, pleasant. HEENT: Atraumatic and Normocephalic, Chest:Good air entry bilaterally, no added sounds. CVS: S1 S2 regular, no murmurs/gallups or rubs. Abdomen: Bowel sounds active, Non tender Extremities: B/L Lower Ext shows no edema, both legs are warm to touch Msc: significant lower back pain, r>L w/ radiculopathy. Gait impaired due to  pain. Neurology: Awake alert, and oriented X 3, CN II-XII grossly intact, Non focal Skin:No Rash  Data Review No results found for: HGBA1C  Depression screen [facility] 2/9 05/01/2016 03/21/2016 02/28/2016  Decreased Interest 0 1 0  Down, Depressed, Hopeless 3 2 0  PHQ - 2 Score 3 3 0  Altered sleeping 3 3 0  Tired, decreased energy 3 3 0  Change in appetite 0 0 0  Feeling bad or failure about yourself  3 0 0  Trouble concentrating 3 0 0  Moving slowly or fidgety/restless 3 0 0  Suicidal thoughts 0 0 0  PHQ-9 Score 18 9 0  Difficult doing work/chores - - Not difficult at all      Assessment & Plan   1. Herniated disc w/ lumbar radiculopathy - pt requesting trx of care to [place] pcp, [facility] apparently pt use to see him prior and has talked to them recently, will be able to get referral to see ortho in [place]. - per pt, he owes [facility] $ and unable to see them here.   Patient have been counseled extensively about nutrition and exercise  Return in about 3 months (around 08/01/2016), or if symptoms worsen or fail to improve.  The patient was given clear instructions to go to ER or return to medical center if symptoms don't improve, worsen or new problems develop. The patient verbalized understanding. The patient was told to call to get lab results if they haven't heard anything in the next week.   This note has been created with Education officer, environmentalDragon speech recognition software and smart phrase technology. Any transcriptional errors are unintentional.   Pete Glatterawn T Emy Angevine, MD, MBA/MHA [facility] [place], [place] [phone]   05/01/2016, 4:02 PM

## 2016-11-01 IMAGING — MR MR LUMBAR SPINE W/O CM
4 of 5 series · 18 of 48 positions shown · non-contrast
Comparison: 07/25/2004 MR.

CLINICAL DATA: 59-year-old male low back pain for 2 months. Right
sciatica from hip to right foot. Initial encounter.

EXAM:
MRI LUMBAR SPINE WITHOUT CONTRAST
TECHNIQUE: Multiplanar, multisequence MR imaging of the lumbar spine was
performed. No intravenous contrast was administered.

[Series 3: T2 · sagittal · 4.0mm · 0.55mm/px · 6 of 12 slices shown (1 of 2)]
[im 1/12]
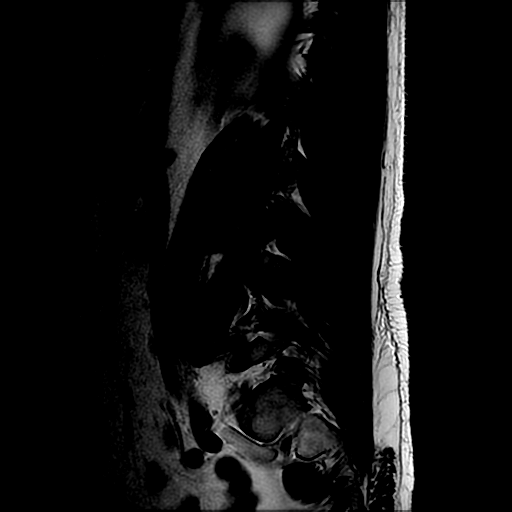
[im 3/12]
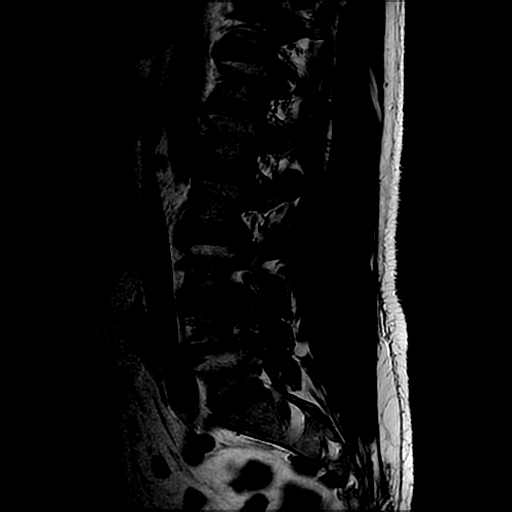
[im 5/12]
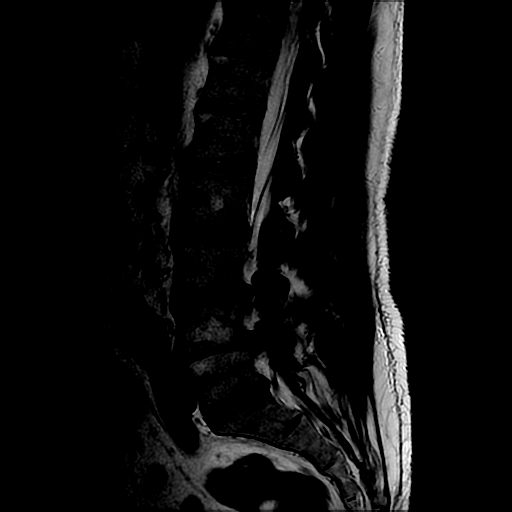
[im 7/12]
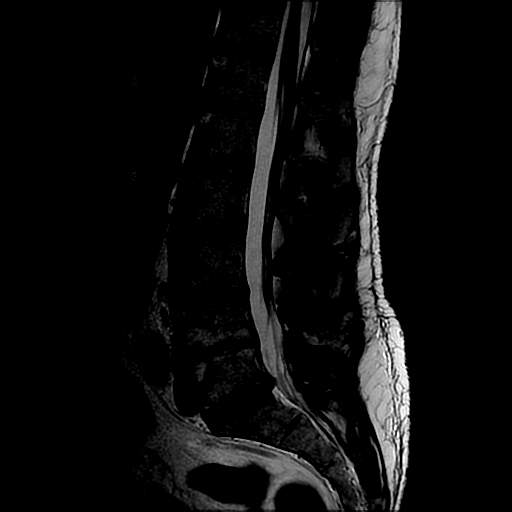
[im 9/12]
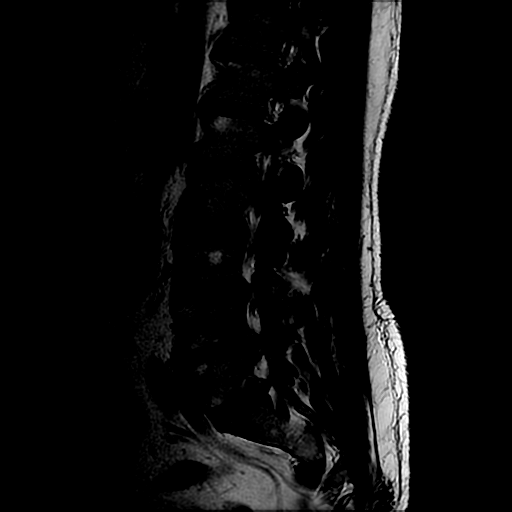
[im 12/12]
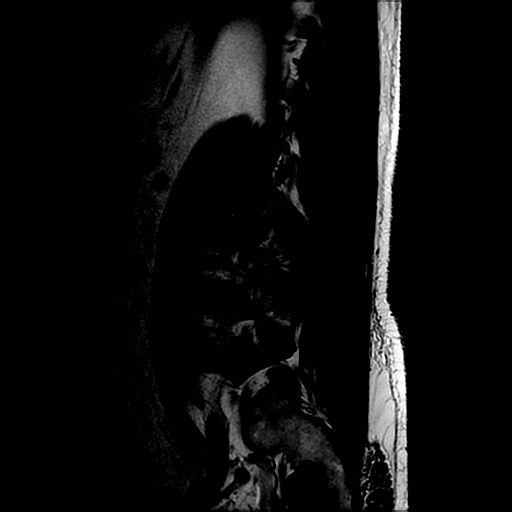

[Series 4: T1 · sagittal · 4.0mm · 0.55mm/px · 3 of 12 slices shown (1 of 2)]
[im 3/12]
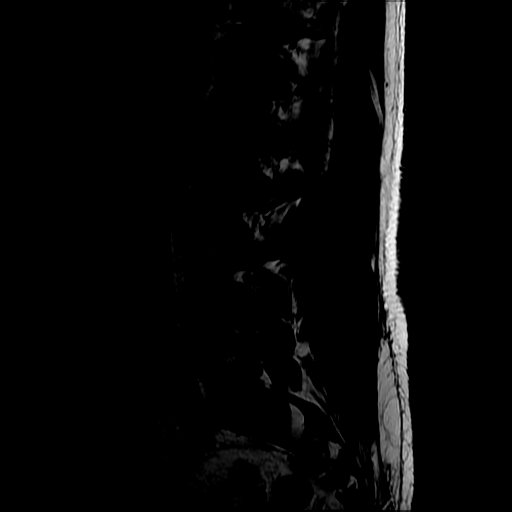
[im 7/12]
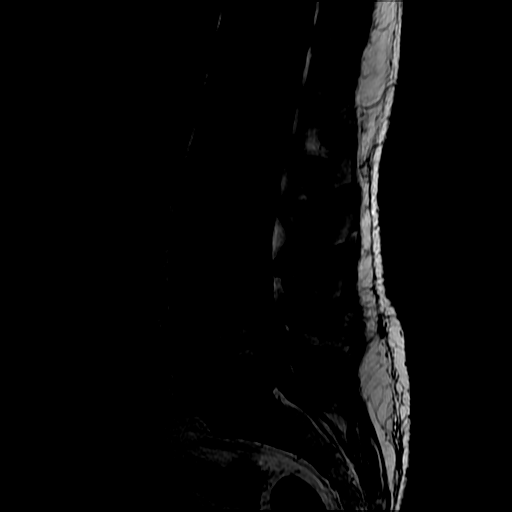
[im 12/12]
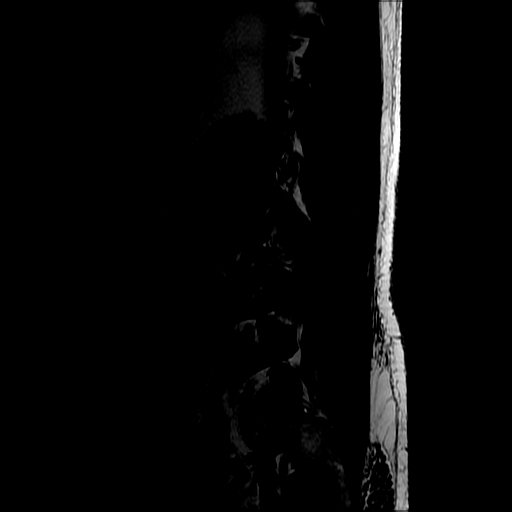

[Series 6: T2 · axial · 4.0mm · 0.39mm/px · z∈[-61,+90]mm · 6 of 32 slices shown (2 of 2)]
[im 1/32]
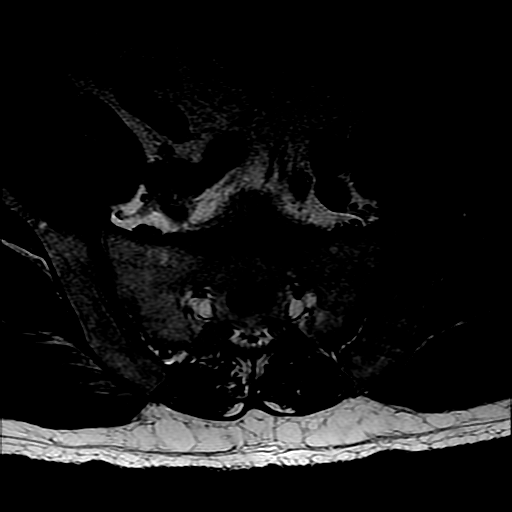
[im 5/32]
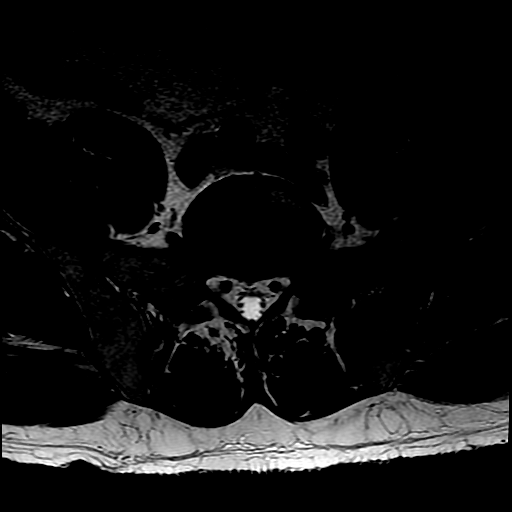
[im 9/32]
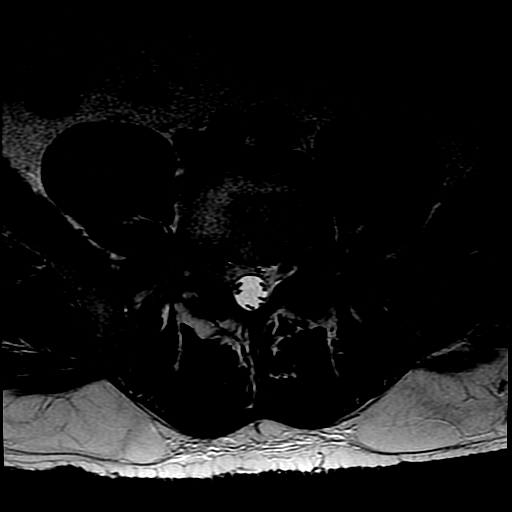
[im 14/32]
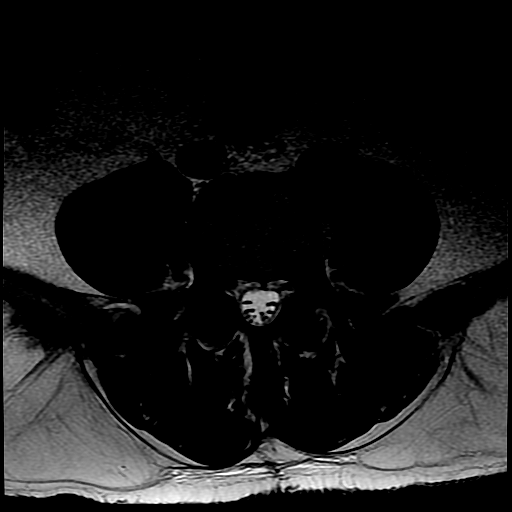
[im 16/32]
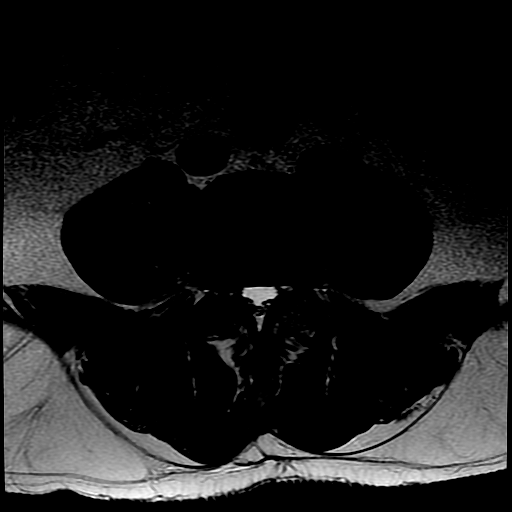
[im 27/32]
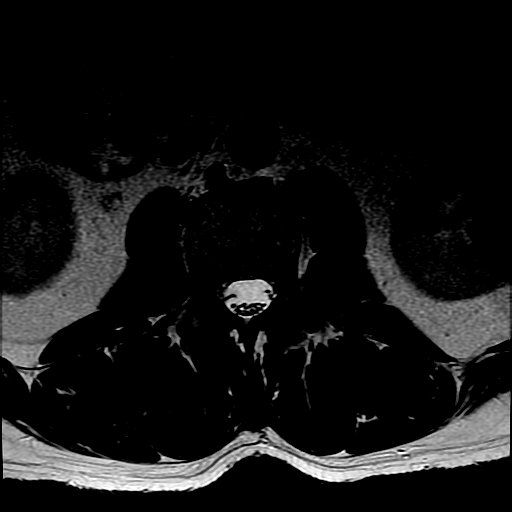

[Series 7: T1 · axial · 4.0mm · 0.39mm/px · z∈[-42,+90]mm · 3 of 32 slices shown (2 of 2)]
[im 5/32]
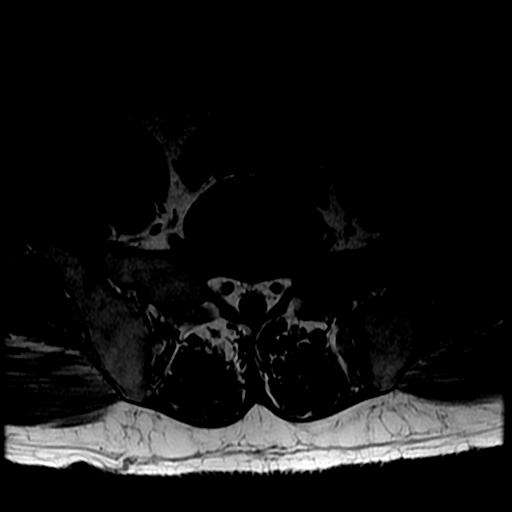
[im 16/32]
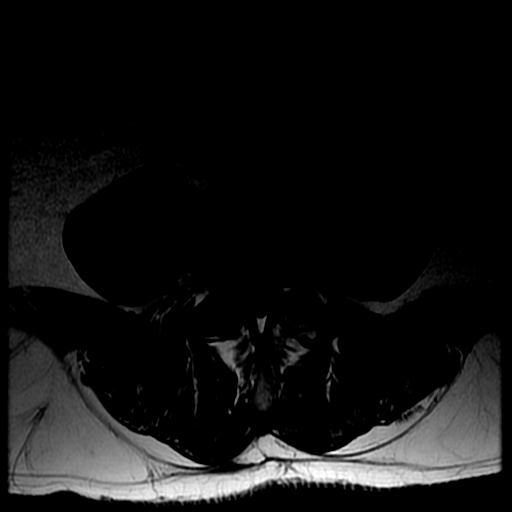
[im 27/32]
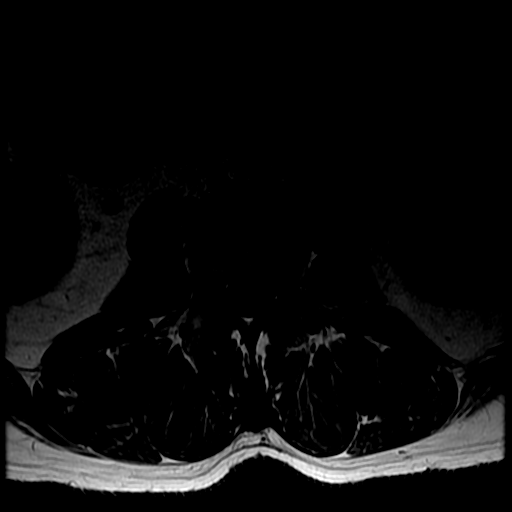

[18 of 48 positions shown; findings below may reference images not displayed]

FINDINGS: Last fully open disk space is labeled L5-S1. Present examination
incorporates from T11 through lower sacrum. Conus lower T12 level.

Small focal fatty deposits and degenerative changes (most notable
L4-5 level) without worrisome osseous lesion.

Atherosclerotic changes abdominal aorta with mild ectasia. Bulge
just above the bifurcation where aorta measures up to 2.7 cm and
just above this level 2.2 cm. Slight ectasia common iliac arteries.

T11-12 through L2-3 unremarkable.

L3-4: Facet degenerative changes. Mild ligamentum flavum
hypertrophy. Bulge and osteophyte extending into the inferior aspect
of the neural foramen. Mild bilateral foraminal narrowing without
nerve root compression. Mild spinal stenosis. Mild to moderate
right-sided and mild left-sided lateral recess stenosis.

L4-5: Disc degeneration with endplate reactive changes. Prior right
hemilaminectomy. Bulge and osteophyte with foraminal extension
greater on the right which combined with facet degenerative changes
causes moderate right-sided and mild to moderate left-sided
foraminal narrowing with encroachment upon the exiting L4 nerve
roots greater on the right. No significant spinal stenosis. Left
paracentral spur. Moderate left-sided lateral recess stenosis. Scar
suspected in the right lateral recess.

L5-S1: Bulge and spur slightly greater to left with mild S1 nerve
root contact greater on the left. Facet degenerative changes.
Minimal foraminal narrowing greater on the left.
IMPRESSION: L3-4 multifactorial mild bilateral foraminal narrowing without nerve
root compression. Mild spinal stenosis. Mild to moderate right-sided
and mild left-sided lateral recess stenosis.

L4-5 prior right laminectomy. Multifactorial moderate right-sided
and mild to moderate left-sided foraminal narrowing with
encroachment upon the exiting L4 nerve roots greater on the right.
No significant spinal stenosis. Left paracentral spur. Moderate
left-sided lateral recess stenosis. Scar suspected in the right
lateral recess.

L5-S1 bulge and spur slightly greater to left with mild S1 nerve
root contact greater on the left. Minimal foraminal narrowing
greater on the left.

Atherosclerotic changes abdominal aorta with mild ectasia. Bulge
just above the bifurcation where aorta measures up to 2.7 cm and
just above this level 2.2 cm. Slight ectasia common iliac arteries.

## 2017-01-30 ENCOUNTER — Encounter (HOSPITAL_COMMUNITY): Payer: Self-pay | Admitting: *Deleted

## 2017-01-30 ENCOUNTER — Emergency Department (HOSPITAL_COMMUNITY)
Admission: EM | Admit: 2017-01-30 | Discharge: 2017-01-31 | Disposition: A | Discharge status: Home / Self Care | Payer: Medicaid Other | Attending: Emergency Medicine | Admitting: Emergency Medicine

## 2017-01-30 DIAGNOSIS — J45909 Unspecified asthma, uncomplicated: Secondary | ICD-10-CM | POA: Insufficient documentation

## 2017-01-30 DIAGNOSIS — Z79899 Other long term (current) drug therapy: Secondary | ICD-10-CM | POA: Diagnosis not present

## 2017-01-30 DIAGNOSIS — H9201 Otalgia, right ear: Secondary | ICD-10-CM

## 2017-01-30 DIAGNOSIS — H6091 Unspecified otitis externa, right ear: Secondary | ICD-10-CM | POA: Diagnosis not present

## 2017-01-30 DIAGNOSIS — H60501 Unspecified acute noninfective otitis externa, right ear: Secondary | ICD-10-CM

## 2017-01-30 NOTE — ED Triage Notes (Signed)
Pt reports right ear swelling, redness, drainage x 2 days. Feel like the swelling is going down into his jaw, denies dental pain. No acute distress is noted at triage.

## 2017-01-31 MED ORDER — CIPROFLOXACIN-DEXAMETHASONE 0.3-0.1 % OT SUSP: 4.0000 [drp] | Freq: Two times a day (BID) | OTIC | 0 refills | Status: DC

## 2017-01-31 MED ORDER — AMOXICILLIN 500 MG PO CAPS: 500.0000 mg | ORAL_CAPSULE | Freq: Three times a day (TID) | ORAL | 0 refills | Status: DC

## 2017-01-31 NOTE — ED Provider Notes (Signed)
[facility]-EMERGENCY DEPT Provider Note   CSN: [id number] Arrival date & time: 01/30/17  1756     History   Chief Complaint Chief Complaint  Patient presents with  . Otalgia    HPI Larry Shaw is a 61 y.o. male.  Patient with PMH of asthma presents to the ED with a chief complaint of right ear pain.  He states that he noticed the symptoms yesterday.  His symptoms have been progressively worsening.  He denies any associated fevers or chills.  He states that he does have some ringing in his ears after blowing his nose.  He denies any sore throat or cough.  He is not diabetic.  He has not taken anything for his symptoms.  He denies any swimming.   The history is provided by the patient. No language interpreter was used.    Past Medical History:  Diagnosis Date  . Asthma     Patient Active Problem List   Diagnosis Date Noted  . Lumbar herniated disc 02/28/2016  . Back pain at L4-L5 level 01/13/2016    Past Surgical History:  Procedure Laterality Date  . LEG SURGERY         Home Medications    Prior to Admission medications   Medication Sig Start Date End Date Taking? Authorizing Provider  acetaminophen-codeine (TYLENOL #3) 300-30 MG tablet Take 1 tablet by mouth every 4 (four) hours as needed for moderate pain. 03/21/16   Langeland, Kathaleen Grinderawn T, MD  albuterol (PROVENTIL HFA;VENTOLIN HFA) 108 (90 BASE) MCG/ACT inhaler Inhale 2 puffs into the lungs every 6 (six) hours as needed for wheezing. 10/29/12   Johnson, Clanford L, MD  cetirizine (ZYRTEC) 10 MG tablet Take 1 tablet (10 mg total) by mouth daily. Patient not taking: Reported on 05/01/2016 02/28/16   Dierdre SearlesLangeland, Dawn T, MD  cetirizine-pseudoephedrine (ZYRTEC-D) 5-120 MG tablet Take 1 tablet by mouth 2 (two) times daily. Patient not taking: Reported on 02/28/2016 11/10/15   Felicie MornSmith, David, NP  cromolyn (NASALCROM) 5.2 MG/ACT nasal spray Place 1 spray into both nostrils 4 (four) times daily. Patient not taking: Reported on 05/01/2016  11/10/15   Felicie MornSmith, David, NP  diclofenac sodium (VOLTAREN) 1 % GEL Apply 2 g topically 4 (four) times daily. 02/28/16   Langeland, Kathaleen Grinderawn T, MD  fluticasone (FLONASE) 50 MCG/ACT nasal spray Place 2 sprays into both nostrils daily. Patient not taking: Reported on 05/01/2016 02/28/16   Pete GlatterLangeland, Dawn T, MD  gabapentin (NEURONTIN) 300 MG capsule Take 1 capsule (300 mg total) by mouth 3 (three) times daily. Patient not taking: Reported on 05/01/2016 01/10/16   Cheri Fowlerose, Kayla, PA-C  methocarbamol (ROBAXIN-750) 750 MG tablet Take 1 tablet (750 mg total) by mouth every 6 (six) hours as needed for muscle spasms. 03/21/16   Pete GlatterLangeland, Dawn T, MD  oxyCODONE-acetaminophen (PERCOCET/ROXICET) 5-325 MG tablet Take 2 tablets by mouth every 4 (four) hours as needed for severe pain. Patient not taking: Reported on 05/01/2016 09/07/15   Patel-Mills, Lorelle FormosaHanna, PA-C  predniSONE (DELTASONE) 20 MG tablet Take 1 tablet (20 mg total) by mouth daily with breakfast. Day 1-4 take 2 tabs (=40mg ) daily in AM, day 5-8 take 1 tab (=20mg ) daily in AM, than stop Patient not taking: Reported on 03/21/2016 02/28/16   Dierdre SearlesLangeland, Dawn T, MD  sodium chloride (OCEAN) 0.65 % SOLN nasal spray Place 1 spray into both nostrils as needed for congestion. 11/10/15   Felicie MornSmith, David, NP  traMADol (ULTRAM) 50 MG tablet Take 1 tablet (50 mg total) by mouth every  6 (six) hours as needed. 05/01/16   Pete Glatter, MD    Family History History reviewed. No pertinent family history.  Social History Social History  Substance Use Topics  . Smoking status: Never Smoker  . Smokeless tobacco: Not on file  . Alcohol use No     Allergies   Penicillins   Review of Systems Review of Systems  All other systems reviewed and are negative.    Physical Exam Updated Vital Signs BP (!) 149/90   Pulse 72   Temp 99.7 F (37.6 C) (Oral)   Resp 16   SpO2 98%   Physical Exam  Constitutional: He is oriented to person, place, and time. He appears well-developed and  well-nourished.  HENT:  Head: Normocephalic and atraumatic.  Right ear canal is edematous with debris, the TM is not able to be visualized because of the edema No mastoid tenderness No apparent dental abscess  Eyes: Conjunctivae and EOM are normal. Pupils are equal, round, and reactive to light. Right eye exhibits no discharge. Left eye exhibits no discharge. No scleral icterus.  Neck: Normal range of motion. Neck supple. No JVD present.  Anterior cervical chain lymphadenopathy  Cardiovascular: Normal rate, regular rhythm and normal heart sounds.  Exam reveals no gallop and no friction rub.   No murmur heard. Pulmonary/Chest: Effort normal and breath sounds normal. No respiratory distress. He has no wheezes. He has no rales. He exhibits no tenderness.  Abdominal: Soft. He exhibits no distension and no mass. There is no tenderness. There is no rebound and no guarding.  Musculoskeletal: Normal range of motion. He exhibits no edema or tenderness.  Neurological: He is alert and oriented to person, place, and time.  Skin: Skin is warm and dry.  Psychiatric: He has a normal mood and affect. His behavior is normal. Judgment and thought content normal.  Nursing note and vitals reviewed.    ED Treatments / Results  Labs (all labs ordered are listed, but only abnormal results are displayed) Labs Reviewed - No data to display  EKG  EKG Interpretation None       Radiology No results found.  Procedures Procedures (including critical care time)  Medications Ordered in ED Medications - No data to display   Initial Impression / Assessment and Plan / ED Course  I have reviewed the triage vital signs and the nursing notes.  Pertinent labs & imaging results that were available during my care of the patient were reviewed by me and considered in my medical decision making (see chart for details).     Patient with symptoms and exam consistent with otitis externa.  Possible OM, though the  TM was no visualized due to edema.  However, the ear canal is not so swollen as to require an ear wick.   Will treat with ciprodex and will also cover for OM with amox.  No mastoid tenderness.  No sign of abscess.  Outpatient follow-up as needed or return here if symptoms worsen.  Final Clinical Impressions(s) / ED Diagnoses   Final diagnoses:  Acute otitis externa of right ear, unspecified type  Otalgia of right ear    New Prescriptions New Prescriptions   AMOXICILLIN (AMOXIL) 500 MG CAPSULE    Take 1 capsule (500 mg total) by mouth 3 (three) times daily.   CIPROFLOXACIN-DEXAMETHASONE (CIPRODEX) OTIC SUSPENSION    Place 4 drops into the right ear 2 (two) times daily.     Roxy Horseman, PA-C 01/31/17 [phone]  Glynn Octave, MD 01/31/17 [phone]

## 2017-08-03 ENCOUNTER — Encounter (HOSPITAL_COMMUNITY): Payer: Self-pay | Admitting: Emergency Medicine

## 2017-08-03 ENCOUNTER — Emergency Department (HOSPITAL_COMMUNITY)
Admission: EM | Admit: 2017-08-03 | Discharge: 2017-08-03 | Disposition: A | Discharge status: Home / Self Care | Payer: Medicaid Other | Attending: Physician Assistant | Admitting: Physician Assistant

## 2017-08-03 DIAGNOSIS — L299 Pruritus, unspecified: Secondary | ICD-10-CM | POA: Diagnosis present

## 2017-08-03 DIAGNOSIS — J45909 Unspecified asthma, uncomplicated: Secondary | ICD-10-CM | POA: Diagnosis not present

## 2017-08-03 DIAGNOSIS — N481 Balanitis: Secondary | ICD-10-CM | POA: Diagnosis not present

## 2017-08-03 DIAGNOSIS — L509 Urticaria, unspecified: Secondary | ICD-10-CM | POA: Insufficient documentation

## 2017-08-03 MED ORDER — HYDROXYZINE HCL 25 MG PO TABS
25.0000 mg | ORAL_TABLET | Freq: Once | ORAL | Status: AC
  Administered 2017-08-03: 25 mg via ORAL
  Filled 2017-08-03: qty 1

## 2017-08-03 MED ORDER — CLOTRIMAZOLE 1 % EX CREA: TOPICAL_CREAM | CUTANEOUS | 0 refills | Status: DC

## 2017-08-03 MED ORDER — HYDROXYZINE HCL 25 MG PO TABS: 25.0000 mg | ORAL_TABLET | Freq: Four times a day (QID) | ORAL | 0 refills | Status: DC

## 2017-08-03 NOTE — ED Provider Notes (Signed)
[facility]   CSN: [id number] Arrival date & time: 08/03/17  2134     History   Chief Complaint Chief Complaint  Patient presents with  . Allergic Reaction    HPI Larry Shaw is a 61 y.o. male.  HPI   Larry Shaw is a 61 year old male with a history of asthma, back pain who presents emergency department for evaluation of hand and penile itching.  Patient reports taking Vicks cold pill 4 hours prior to arrival.  States that he has had itching between the second and third finger of the right hand since taking the medication.  The webspace is now red between these fingers due to itching.  Patient also states that he has penile itching and burning sensation since taking the Vicks. Reports taking two Benadryl without significant relief.  States that he has had itching in the past with Vicks medication.  States "it is in my blood, I need a shot to that I can get this infection out of my blood." No rash, fever, shortness of breath, throat closing, chest pain.  He denies recent sexual activity.  Denies redness and itching in the penis prior to taking Vicks.  Past Medical History:  Diagnosis Date  . Asthma     Patient Active Problem List   Diagnosis Date Noted  . Lumbar herniated disc 02/28/2016  . Back pain at L4-L5 level 01/13/2016    Past Surgical History:  Procedure Laterality Date  . LEG SURGERY         Home Medications    Prior to Admission medications   Medication Sig Start Date End Date Taking? Authorizing Provider  acetaminophen-codeine (TYLENOL #3) 300-30 MG tablet Take 1 tablet by mouth every 4 (four) hours as needed for moderate pain. 03/21/16   Langeland, Kathaleen Grinderawn T, MD  albuterol (PROVENTIL HFA;VENTOLIN HFA) 108 (90 BASE) MCG/ACT inhaler Inhale 2 puffs into the lungs every 6 (six) hours as needed for wheezing. 10/29/12   Johnson, Clanford L, MD  amoxicillin (AMOXIL) 500 MG capsule Take 1 capsule (500 mg total) by  mouth 3 (three) times daily. 01/31/17   Roxy HorsemanBrowning, Robert, PA-C  cetirizine (ZYRTEC) 10 MG tablet Take 1 tablet (10 mg total) by mouth daily. Patient not taking: Reported on 05/01/2016 02/28/16   Dierdre SearlesLangeland, Dawn T, MD  cetirizine-pseudoephedrine (ZYRTEC-D) 5-120 MG tablet Take 1 tablet by mouth 2 (two) times daily. Patient not taking: Reported on 02/28/2016 11/10/15   Felicie MornSmith, David, NP  ciprofloxacin-dexamethasone [facility](CIPRODEX) otic suspension Place 4 drops into the right ear 2 (two) times daily. 01/31/17   Roxy HorsemanBrowning, Robert, PA-C  cromolyn (NASALCROM) 5.2 MG/ACT nasal spray Place 1 spray into both nostrils 4 (four) times daily. Patient not taking: Reported on 05/01/2016 11/10/15   Felicie MornSmith, David, NP  diclofenac sodium (VOLTAREN) 1 % GEL Apply 2 g topically 4 (four) times daily. 02/28/16   Langeland, Kathaleen Grinderawn T, MD  fluticasone (FLONASE) 50 MCG/ACT nasal spray Place 2 sprays into both nostrils daily. Patient not taking: Reported on 05/01/2016 02/28/16   Pete GlatterLangeland, Dawn T, MD  gabapentin (NEURONTIN) 300 MG capsule Take 1 capsule (300 mg total) by mouth 3 (three) times daily. Patient not taking: Reported on 05/01/2016 01/10/16   Cheri Fowlerose, Kayla, PA-C  methocarbamol (ROBAXIN-750) 750 MG tablet Take 1 tablet (750 mg total) by mouth every 6 (six) hours as needed for muscle spasms. 03/21/16   Pete GlatterLangeland, Dawn T, MD  oxyCODONE-acetaminophen (PERCOCET/ROXICET) 5-325 MG tablet Take 2 tablets by mouth every 4 (  four) hours as needed for severe pain. Patient not taking: Reported on 05/01/2016 09/07/15   Patel-Mills, Lorelle Formosa, PA-C  predniSONE (DELTASONE) 20 MG tablet Take 1 tablet (20 mg total) by mouth daily with breakfast. Day 1-4 take 2 tabs (=40mg ) daily in AM, day 5-8 take 1 tab (=20mg ) daily in AM, than stop Patient not taking: Reported on 03/21/2016 02/28/16   Dierdre Searles T, MD  sodium chloride (OCEAN) 0.65 % SOLN nasal spray Place 1 spray into both nostrils as needed for congestion. 11/10/15   [name], NP  traMADol (ULTRAM) 50 MG  tablet Take 1 tablet (50 mg total) by mouth every 6 (six) hours as needed. 05/01/16   Pete Glatter, MD    Family History No family history on file.  Social History Social History   Tobacco Use  . Smoking status: Never Smoker  . Smokeless tobacco: Never Used  Substance Use Topics  . Alcohol use: No  . Drug use: No     Allergies   Penicillins   Review of Systems Review of Systems  Constitutional: Negative for chills, fatigue and fever.  Respiratory: Negative for chest tightness, shortness of breath, wheezing and stridor.   Cardiovascular: Negative for chest pain.  Genitourinary: Positive for genital sores (redness on glans penis) and penile pain (burning pain on the penis). Negative for difficulty urinating, dysuria, flank pain, frequency, penile swelling, scrotal swelling and testicular pain.  Musculoskeletal: Negative for arthralgias, joint swelling and myalgias.  Skin: Positive for rash (redness on the penis and between the 2nd and 3rd finger of the right hand. ).     Physical Exam Updated Vital Signs BP 133/73 (BP Location: Left Arm)   Pulse 73   Temp 98 F (36.7 C) (Oral)   Resp 18   Ht 5\' 9"  (1.753 m)   Wt 93.4 kg (206 lb)   SpO2 98%   BMI 30.42 kg/m   Physical Exam  Constitutional: He appears well-developed and well-nourished. No distress.  HENT:  Head: Normocephalic and atraumatic.  Mouth/Throat: Oropharynx is clear and moist. No oropharyngeal exudate.  No angioedema.  No facial swelling.  Airway patent.  Eyes: Right eye exhibits no discharge. Left eye exhibits no discharge.  Cardiovascular: Normal rate, regular rhythm and intact distal pulses. Exam reveals no friction rub.  No murmur heard. Pulmonary/Chest: Effort normal and breath sounds normal. No stridor. No respiratory distress. He has no wheezes. He has no rales.  Genitourinary:  Genitourinary Comments: Chaperone present for exam. Circumcised male, with erythematous, blotchy rash over the glans  penis. No drainage. Several excoriations noted over the glans penis, no open lesions. No discharge from the penis. The penis and testicles are nontender. No testicular masses or swelling.   Musculoskeletal:  Dry, flaky skin noted in the web spaces of the fingers bilaterally. Erythema noted with excoriations between the second and third webspace of the right hand. No papules, vesicles or pustules noted. No rash or overlying warmth or drainage. Full ROM of fingers and wrist bilaterally. Grip strength 5/5. Radial pulse 2+.   Neurological: He is alert. Coordination normal.  Skin: Skin is warm and dry. Capillary refill takes less than 2 seconds. He is not diaphoretic.  Psychiatric: He has a normal mood and affect. His behavior is normal.  Nursing note and vitals reviewed.    ED Treatments / Results  Labs (all labs ordered are listed, but only abnormal results are displayed) Labs Reviewed - No data to display  EKG  EKG Interpretation None  Radiology No results found.  Procedures Procedures (including critical care time)  Medications Ordered in ED Medications  hydrOXYzine (ATARAX/VISTARIL) tablet 25 mg (not administered)     Initial Impression / Assessment and Plan / ED Course  I have reviewed the triage vital signs and the nursing notes.  Pertinent labs & imaging results that were available during my care of the patient were reviewed by me and considered in my medical decision making (see chart for details).    Patient presents with itching in the webspace between the 2nd and 3rd digits of the right hand. He also has itching of the glans penis. Skin appears dry in the web spaces, suspect that this is related to patients itching sensation. No papules or burrows to suggest scabies infection. Patient given hydroxyzine in the ER and he reports subsequent improvement in symptoms.   Glans penis with blotchy rash that itches. It appears to be candida infection. No drainage or pus.  Will treat balanitis with clotrimazole cream. Discussed hygiene with patient who agrees and voices understanding. Discussed return precautions and patient agrees. Patients vital signs stable, in no acute distress and has no complaints prior to discharge.    Final Clinical Impressions(s) / ED Diagnoses   Final diagnoses:  Urticaria  Balanitis    ED Discharge Orders        Ordered    clotrimazole (LOTRIMIN) 1 % cream     08/03/17 2228    hydrOXYzine (ATARAX/VISTARIL) 25 MG tablet  Every 6 hours     08/03/17 2228       Kellie ShropshireShrosbree, Emily J, PA-C 08/04/17 0133    Abelino DerrickMackuen, Courteney Lyn, MD 08/04/17 2150

## 2017-08-03 NOTE — Discharge Instructions (Signed)
Please fill prescription for hydroxyzine.  This is a medicine that helps with itching.  Take this every 6 hours as needed for itching.  I have written you a prescription for a cream to put on the area of redness on your penis.  Please apply this twice a day for the next week.  Please schedule an appointment with your primary doctor if your symptoms are not improving in a week.  Return to the emergency department if you develop shortness of breath, feeling of throat closing, worsening redness and itching despite taking medicines.  Please also return for any new or worsening symptoms.

## 2017-08-03 NOTE — ED Triage Notes (Signed)
Reports taking a vicks cold pill 4 hour pta.  Now c/o itching and redness to hands and groin per patient.  Breathing easy and nonlabored.  Took 2 benadryl pta.

## 2017-08-03 NOTE — ED Notes (Signed)
Pt understood dc material. NAD Noted. SCripts given at dc 

## 2017-08-03 NOTE — ED Notes (Signed)
Chaperoned Genital exam with provider

## 2017-12-02 ENCOUNTER — Other Ambulatory Visit: Payer: Self-pay

## 2017-12-02 ENCOUNTER — Emergency Department (HOSPITAL_COMMUNITY)
Admission: EM | Admit: 2017-12-02 | Discharge: 2017-12-02 | Disposition: A | Discharge status: Home / Self Care | Payer: Medicaid Other | Attending: Emergency Medicine | Admitting: Emergency Medicine

## 2017-12-02 ENCOUNTER — Encounter (HOSPITAL_COMMUNITY): Payer: Self-pay

## 2017-12-02 DIAGNOSIS — Z79899 Other long term (current) drug therapy: Secondary | ICD-10-CM | POA: Diagnosis not present

## 2017-12-02 DIAGNOSIS — R21 Rash and other nonspecific skin eruption: Secondary | ICD-10-CM | POA: Insufficient documentation

## 2017-12-02 DIAGNOSIS — J45909 Unspecified asthma, uncomplicated: Secondary | ICD-10-CM | POA: Insufficient documentation

## 2017-12-02 MED ORDER — LORATADINE 10 MG PO TABS: 10.0000 mg | ORAL_TABLET | Freq: Every day | ORAL | 0 refills | Status: DC

## 2017-12-02 MED ORDER — HYDROCORTISONE 1 % EX CREA: TOPICAL_CREAM | CUTANEOUS | 0 refills | Status: DC

## 2017-12-02 NOTE — ED Notes (Signed)
D/c reviewed with teach back 

## 2017-12-02 NOTE — Discharge Instructions (Signed)
Please read attached information. If you experience any new or worsening signs or symptoms please return to the emergency room for evaluation. Please follow-up with your primary care provider or specialist as discussed. Please use medication prescribed only as directed and discontinue taking if you have any concerning signs or symptoms.   °

## 2017-12-02 NOTE — ED Provider Notes (Signed)
[facility] EMERGENCY DEPARTMENT Provider Note   CSN: [id number] Arrival date & time: 12/02/17  1121     History   Chief Complaint Chief Complaint  Patient presents with  . Rash    HPI Larry Shaw is a 62 y.o. male.  HPI   62 year old male presents today with complaints of rash.  Patient reports yesterday he noted small spots on his bilateral hands and face.  He notes these were red and slightly itchy.  Patient reports that he checked under his bed to look for bedbugs and did not see any.  He denies any history of the same.  Denies any fever chills weight loss nausea vomiting.  He denies any other dermal involvement.  No close contacts with similar symptoms.  No changes in body care products or exposures.  Past Medical History:  Diagnosis Date  . Asthma     Patient Active Problem List   Diagnosis Date Noted  . Lumbar herniated disc 02/28/2016  . Back pain at L4-L5 level 01/13/2016    Past Surgical History:  Procedure Laterality Date  . LEG SURGERY         Home Medications    Prior to Admission medications   Medication Sig Start Date End Date Taking? Authorizing Provider  acetaminophen-codeine (TYLENOL #3) 300-30 MG tablet Take 1 tablet by mouth every 4 (four) hours as needed for moderate pain. 03/21/16   Langeland, Kathaleen Grinder, MD  albuterol (PROVENTIL HFA;VENTOLIN HFA) 108 (90 BASE) MCG/ACT inhaler Inhale 2 puffs into the lungs every 6 (six) hours as needed for wheezing. 10/29/12   Johnson, Clanford L, MD  amoxicillin (AMOXIL) 500 MG capsule Take 1 capsule (500 mg total) by mouth 3 (three) times daily. 01/31/17   Roxy Horseman, PA-C  cetirizine (ZYRTEC) 10 MG tablet Take 1 tablet (10 mg total) by mouth daily. Patient not taking: Reported on 05/01/2016 02/28/16   Dierdre Searles T, MD  cetirizine-pseudoephedrine (ZYRTEC-D) 5-120 MG tablet Take 1 tablet by mouth 2 (two) times daily. Patient not taking: Reported on 02/28/2016 11/10/15   [name], NP    ciprofloxacin-dexamethasone [facility]) otic suspension Place 4 drops into the right ear 2 (two) times daily. 01/31/17   Roxy Horseman, PA-C  clotrimazole (LOTRIMIN) 1 % cream Apply to penis area 2 times daily until redness goes away. 08/03/17   Kellie Shropshire, PA-C  cromolyn (NASALCROM) 5.2 MG/ACT nasal spray Place 1 spray into both nostrils 4 (four) times daily. Patient not taking: Reported on 05/01/2016 11/10/15   [name], NP  diclofenac sodium (VOLTAREN) 1 % GEL Apply 2 g topically 4 (four) times daily. 02/28/16   Langeland, Kathaleen Grinder, MD  fluticasone (FLONASE) 50 MCG/ACT nasal spray Place 2 sprays into both nostrils daily. Patient not taking: Reported on 05/01/2016 02/28/16   Pete Glatter, MD  gabapentin (NEURONTIN) 300 MG capsule Take 1 capsule (300 mg total) by mouth 3 (three) times daily. Patient not taking: Reported on 05/01/2016 01/10/16   Cheri Fowler, PA-C  hydrocortisone cream 1 % Apply to affected area 2 times daily 12/02/17   Maija Biggers, Tinnie Gens, PA-C  hydrOXYzine (ATARAX/VISTARIL) 25 MG tablet Take 1 tablet (25 mg total) every 6 (six) hours by mouth. 08/03/17   Mady Gemma, Danella Penton, PA-C  loratadine (CLARITIN) 10 MG tablet Take 1 tablet (10 mg total) by mouth daily. 12/02/17   Cloud Graham, Tinnie Gens, PA-C  methocarbamol (ROBAXIN-750) 750 MG tablet Take 1 tablet (750 mg total) by mouth every 6 (six) hours as needed for  muscle spasms. 03/21/16   Pete Glatter, MD  oxyCODONE-acetaminophen (PERCOCET/ROXICET) 5-325 MG tablet Take 2 tablets by mouth every 4 (four) hours as needed for severe pain. Patient not taking: Reported on 05/01/2016 09/07/15   Patel-Mills, Lorelle Formosa, PA-C  predniSONE (DELTASONE) 20 MG tablet Take 1 tablet (20 mg total) by mouth daily with breakfast. Day 1-4 take 2 tabs (=40mg ) daily in AM, day 5-8 take 1 tab (=20mg ) daily in AM, than stop Patient not taking: Reported on 03/21/2016 02/28/16   Dierdre Searles T, MD  sodium chloride (OCEAN) 0.65 % SOLN nasal spray Place 1 spray into  both nostrils as needed for congestion. 11/10/15   [name], NP  traMADol (ULTRAM) 50 MG tablet Take 1 tablet (50 mg total) by mouth every 6 (six) hours as needed. 05/01/16   Pete Glatter, MD    Family History History reviewed. No pertinent family history.  Social History Social History   Tobacco Use  . Smoking status: Never Smoker  . Smokeless tobacco: Never Used  Substance Use Topics  . Alcohol use: No  . Drug use: No     Allergies   Penicillins   Review of Systems Review of Systems  All other systems reviewed and are negative.   Physical Exam Updated Vital Signs BP 120/73 (BP Location: Right Arm)   Pulse 71   Temp 98.1 F (36.7 C) (Oral)   Resp 18   Ht 5\' 9"  (1.753 m)   Wt 95.3 kg (210 lb)   SpO2 100%   BMI 31.01 kg/m   Physical Exam  Constitutional: He is oriented to person, place, and time. He appears well-developed and well-nourished.  HENT:  Head: Normocephalic and atraumatic.  No intraoral oral rashes or ulcerations  Eyes: Conjunctivae are normal. Pupils are equal, round, and reactive to light. Right eye exhibits no discharge. Left eye exhibits no discharge. No scleral icterus.  Neck: Normal range of motion. No JVD present. No tracheal deviation present.  Pulmonary/Chest: Effort normal. No stridor.  Neurological: He is alert and oriented to person, place, and time. Coordination normal.  Skin:  Several small papules to the forehead and right face, no vesicles, no ulcerations, no surrounding redness  Bilateral upper extremities with discrete papules with excoriation  No palmar involvement  Psychiatric: He has a normal mood and affect. His behavior is normal. Judgment and thought content normal.  Nursing note and vitals reviewed.    ED Treatments / Results  Labs (all labs ordered are listed, but only abnormal results are displayed) Labs Reviewed - No data to display  EKG  EKG Interpretation None       Radiology No results  found.  Procedures Procedures (including critical care time)  Medications Ordered in ED Medications - No data to display   Initial Impression / Assessment and Plan / ED Course  I have reviewed the triage vital signs and the nursing notes.  Pertinent labs & imaging results that were available during my care of the patient were reviewed by me and considered in my medical decision making (see chart for details).      Final Clinical Impressions(s) / ED Diagnoses   Final diagnoses:  Rash    Labs:   Imaging:  Consults:  Therapeutics:  Discharge Meds:   Assessment/Plan: 62 year old male presents today with complaints of rash.  Patient well-appearing in no acute distress.  He has several small areas that have similar in appearance to bedbug bites.  I discussed with the patient, he reports  that he does not have bedbugs.  Patient has no systemic symptoms, no significant dermal involvement.  Pt will be given antihistamines topical steroids encouraged follow-up with his primary care for reevaluation further management.  Patient verbalized understanding and agreement to today's plan had no further questions or concerns at time of discharge.    ED Discharge Orders        Ordered    loratadine (CLARITIN) 10 MG tablet  Daily     12/02/17 1307    hydrocortisone cream 1 %     12/02/17 1307       Rosalio LoudHedges, Remington Skalsky, PA-C 12/02/17 1527    Mancel BaleWentz, Elliott, MD 12/04/17 [phone]

## 2017-12-02 NOTE — ED Triage Notes (Signed)
Pt endorses rash to right forearm, left hand and on the face. No allergies known, airway intact. VSS

## 2017-12-02 NOTE — ED Notes (Signed)
Patient reports having a new rash on his R-arm, L-hand and face. He states that he came in because someone told him that it may be "Herpes"

## 2020-04-29 ENCOUNTER — Other Ambulatory Visit: Payer: Self-pay

## 2020-04-29 ENCOUNTER — Ambulatory Visit (HOSPITAL_COMMUNITY)
Admission: EM | Admit: 2020-04-29 | Discharge: 2020-04-29 | Disposition: A | Discharge status: Home / Self Care | Payer: Medicaid Other | Attending: Family Medicine | Admitting: Family Medicine

## 2020-04-29 DIAGNOSIS — Z20822 Contact with and (suspected) exposure to covid-19: Secondary | ICD-10-CM | POA: Insufficient documentation

## 2020-04-29 DIAGNOSIS — Z01812 Encounter for preprocedural laboratory examination: Secondary | ICD-10-CM | POA: Diagnosis present

## 2020-04-29 LAB — SARS CORONAVIRUS 2 (TAT 6-24 HRS): SARS Coronavirus 2: NEGATIVE

## 2020-04-29 NOTE — ED Notes (Signed)
Accessed patient's chart to obtain covid orders

## 2020-04-29 NOTE — Discharge Instructions (Signed)
If your Covid-19 test is positive, you will get a phone call from [facility] regarding your results. If your Covid-19 test is negative, you will NOT get a phone call from [facility] with your results. You may view your results on MyChart. If you do not have a MyChart account, sign up instructions are in your discharge papers. ° °

## 2020-04-29 NOTE — ED Triage Notes (Signed)
Pt presents for covid testing with no known symptoms. 

## 2020-05-03 ENCOUNTER — Telehealth (HOSPITAL_COMMUNITY): Payer: Self-pay

## 2020-05-12 ENCOUNTER — Emergency Department (HOSPITAL_COMMUNITY)
Admission: EM | Admit: 2020-05-12 | Discharge: 2020-05-12 | Disposition: A | Discharge status: Home / Self Care | Payer: Medicaid Other | Attending: Emergency Medicine | Admitting: Emergency Medicine

## 2020-05-12 ENCOUNTER — Other Ambulatory Visit: Payer: Self-pay

## 2020-05-12 ENCOUNTER — Encounter (HOSPITAL_COMMUNITY): Payer: Self-pay

## 2020-05-12 ENCOUNTER — Emergency Department (HOSPITAL_COMMUNITY): Payer: Medicaid Other

## 2020-05-12 DIAGNOSIS — R0789 Other chest pain: Secondary | ICD-10-CM | POA: Diagnosis present

## 2020-05-12 DIAGNOSIS — J45909 Unspecified asthma, uncomplicated: Secondary | ICD-10-CM | POA: Diagnosis not present

## 2020-05-12 DIAGNOSIS — Z7951 Long term (current) use of inhaled steroids: Secondary | ICD-10-CM | POA: Insufficient documentation

## 2020-05-12 MED ORDER — OXYCODONE-ACETAMINOPHEN 5-325 MG PO TABS: 2.0000 | ORAL_TABLET | ORAL | 0 refills | Status: AC | PRN

## 2020-05-12 MED ORDER — OXYCODONE-ACETAMINOPHEN 5-325 MG PO TABS
1.0000 | ORAL_TABLET | Freq: Once | ORAL | Status: AC
  Administered 2020-05-12: 1 via ORAL
  Filled 2020-05-12: qty 1

## 2020-05-12 NOTE — ED Triage Notes (Signed)
Pt presents with pain to his Right axillary area radiating into his ribs x2 days, pt states the symptoms started after working in his building moving things around, increase pain when lifting his arm .

## 2020-05-12 NOTE — ED Provider Notes (Signed)
[facility] EMERGENCY DEPARTMENT Provider Note   CSN: [id number] Arrival date & time: 05/12/20  1100     History Chief Complaint  Patient presents with  . Chest Pain    Larry Shaw is a 64 y.o. male.  Patient complains of right-sided chest pain.  His pain is worse with movement.  Patient paints frequently  The history is provided by the patient. No language interpreter was used.  Chest Pain Pain location:  L chest Pain quality: aching   Pain radiates to:  Does not radiate Pain severity:  Moderate Onset quality:  Sudden Timing:  Constant Progression:  Unchanged Chronicity:  New Context: not breathing   Relieved by:  Nothing Worsened by:  Nothing Associated symptoms: no abdominal pain, no back pain, no cough, no fatigue and no headache   Risk factors: no aortic disease        Past Medical History:  Diagnosis Date  . Asthma     Patient Active Problem List   Diagnosis Date Noted  . Lumbar herniated disc 02/28/2016  . Back pain at L4-L5 level 01/13/2016    Past Surgical History:  Procedure Laterality Date  . LEG SURGERY         History reviewed. No pertinent family history.  Social History   Tobacco Use  . Smoking status: Never Smoker  . Smokeless tobacco: Never Used  Substance Use Topics  . Alcohol use: No  . Drug use: No    Home Medications Prior to Admission medications   Medication Sig Start Date End Date Taking? Authorizing Provider  acetaminophen-codeine (TYLENOL #3) 300-30 MG tablet Take 1 tablet by mouth every 4 (four) hours as needed for moderate pain. 03/21/16   Langeland, Kathaleen Grinder, MD  albuterol (PROVENTIL HFA;VENTOLIN HFA) 108 (90 BASE) MCG/ACT inhaler Inhale 2 puffs into the lungs every 6 (six) hours as needed for wheezing. 10/29/12   Johnson, Clanford L, MD  amoxicillin (AMOXIL) 500 MG capsule Take 1 capsule (500 mg total) by mouth 3 (three) times daily. 01/31/17   Roxy Horseman, PA-C  cetirizine (ZYRTEC) 10 MG  tablet Take 1 tablet (10 mg total) by mouth daily. Patient not taking: Reported on 05/01/2016 02/28/16   Dierdre Searles T, MD  cetirizine-pseudoephedrine (ZYRTEC-D) 5-120 MG tablet Take 1 tablet by mouth 2 (two) times daily. Patient not taking: Reported on 02/28/2016 11/10/15   [name], NP  ciprofloxacin-dexamethasone [facility]) otic suspension Place 4 drops into the right ear 2 (two) times daily. 01/31/17   Roxy Horseman, PA-C  clotrimazole (LOTRIMIN) 1 % cream Apply to penis area 2 times daily until redness goes away. 08/03/17   Kellie Shropshire, PA-C  cromolyn (NASALCROM) 5.2 MG/ACT nasal spray Place 1 spray into both nostrils 4 (four) times daily. Patient not taking: Reported on 05/01/2016 11/10/15   [name], NP  diclofenac sodium (VOLTAREN) 1 % GEL Apply 2 g topically 4 (four) times daily. 02/28/16   Langeland, Kathaleen Grinder, MD  fluticasone (FLONASE) 50 MCG/ACT nasal spray Place 2 sprays into both nostrils daily. Patient not taking: Reported on 05/01/2016 02/28/16   Pete Glatter, MD  gabapentin (NEURONTIN) 300 MG capsule Take 1 capsule (300 mg total) by mouth 3 (three) times daily. Patient not taking: Reported on 05/01/2016 01/10/16   Cheri Fowler, PA-C  hydrocortisone cream 1 % Apply to affected area 2 times daily 12/02/17   Hedges, Tinnie Gens, PA-C  hydrOXYzine (ATARAX/VISTARIL) 25 MG tablet Take 1 tablet (25 mg total) every 6 (six) hours by  mouth. 08/03/17   Kellie Shropshire, PA-C  loratadine (CLARITIN) 10 MG tablet Take 1 tablet (10 mg total) by mouth daily. 12/02/17   Hedges, Tinnie Gens, PA-C  methocarbamol (ROBAXIN-750) 750 MG tablet Take 1 tablet (750 mg total) by mouth every 6 (six) hours as needed for muscle spasms. 03/21/16   Pete Glatter, MD  oxyCODONE-acetaminophen (PERCOCET) 5-325 MG tablet Take 2 tablets by mouth every 4 (four) hours as needed. 05/12/20   Bethann Berkshire, MD  predniSONE (DELTASONE) 20 MG tablet Take 1 tablet (20 mg total) by mouth daily with breakfast. Day 1-4 take  2 tabs (=40mg ) daily in AM, day 5-8 take 1 tab (=20mg ) daily in AM, than stop Patient not taking: Reported on 03/21/2016 02/28/16   Dierdre Searles T, MD  sodium chloride (OCEAN) 0.65 % SOLN nasal spray Place 1 spray into both nostrils as needed for congestion. 11/10/15   [name], NP  traMADol (ULTRAM) 50 MG tablet Take 1 tablet (50 mg total) by mouth every 6 (six) hours as needed. 05/01/16   Pete Glatter, MD    Allergies    Penicillins  Review of Systems   Review of Systems  Constitutional: Negative for appetite change and fatigue.  HENT: Negative for congestion, ear discharge and sinus pressure.   Eyes: Negative for discharge.  Respiratory: Negative for cough.   Cardiovascular: Positive for chest pain.  Gastrointestinal: Negative for abdominal pain and diarrhea.  Genitourinary: Negative for frequency and hematuria.  Musculoskeletal: Negative for back pain.  Skin: Negative for rash.  Neurological: Negative for seizures and headaches.  Psychiatric/Behavioral: Negative for hallucinations.    Physical Exam Updated Vital Signs BP 100/64 (BP Location: Left Arm)   Pulse 64   Temp 98.1 F (36.7 C) (Oral)   Resp 16   SpO2 96%   Physical Exam Vitals reviewed.  Constitutional:      Appearance: He is well-developed.  HENT:     Head: Normocephalic.     Nose: Nose normal.  Eyes:     General: No scleral icterus.    Conjunctiva/sclera: Conjunctivae normal.  Neck:     Thyroid: No thyromegaly.  Cardiovascular:     Rate and Rhythm: Normal rate and regular rhythm.     Pulses: Normal pulses.     Heart sounds: No murmur heard.  No friction rub. No gallop.   Pulmonary:     Breath sounds: No stridor. No wheezing or rales.     Comments: Tender right chest Chest:     Chest wall: No tenderness.  Abdominal:     General: There is no distension.     Tenderness: There is no abdominal tenderness. There is no rebound.  Musculoskeletal:        General: Normal range of motion.      Cervical back: Neck supple.  Lymphadenopathy:     Cervical: No cervical adenopathy.  Skin:    Findings: No erythema or rash.  Neurological:     Mental Status: He is alert and oriented to person, place, and time.     Motor: No abnormal muscle tone.     Coordination: Coordination normal.  Psychiatric:        Behavior: Behavior normal.     ED Results / Procedures / Treatments   Labs (all labs ordered are listed, but only abnormal results are displayed) Labs Reviewed - No data to display  EKG None  Radiology DG Chest 2 View  Result Date: 05/12/2020 CLINICAL DATA:  Right rib pain and  right axillary pain EXAM: CHEST - 2 VIEW COMPARISON:  02/11/2012 FINDINGS: Normal heart size. No pleural effusion or edema identified. No airspace densities identified. No focal bone abnormality. IMPRESSION: Negative. Electronically Signed   By: Signa Kell M.D.   On: 05/12/2020 11:48    Procedures Procedures (including critical care time)  Medications Ordered in ED Medications  oxyCODONE-acetaminophen (PERCOCET/ROXICET) 5-325 MG per tablet 1 tablet (1 tablet Oral Given 05/12/20 1133)    ED Course  I have reviewed the triage vital signs and the nursing notes.  Pertinent labs & imaging results that were available during my care of the patient were reviewed by me and considered in my medical decision making (see chart for details).    MDM Rules/Calculators/A&P                          Patient with chest wall tenderness.  He is given some Percocets and will follow up as needed       This patient presents to the ED for concern of chest pain this involves an extensive number of treatment options, and is a complaint that carries with it a high risk of complications and morbidity.  The differential diagnosis includes chest wall pain fractured rib pneumonia   Lab Tests:     Medicines ordered:   I ordered medication Percocet for pain  Imaging Studies ordered:   I ordered imaging  studies which included chest x-ray I independently visualized and interpreted imaging which showed unremarkable Additional history obtained:   Additional history obtained from records  Previous records obtained and reviewed.  Consultations Obtained:     Reevaluation:  After the interventions stated above, I reevaluated the patient and found mildly improved  Critical Interventions:  .   Final Clinical Impression(s) / ED Diagnoses Final diagnoses:  Chest wall pain    Rx / DC Orders ED Discharge Orders         Ordered    oxyCODONE-acetaminophen (PERCOCET) 5-325 MG tablet  Every 4 hours PRN        05/12/20 1640           Bethann Berkshire, MD 05/12/20 1650

## 2020-05-12 NOTE — Discharge Instructions (Signed)
Follow-up with your family doctor if any problems.  Take Tylenol for pain and if that does not work you can use the [employer]

## 2020-09-06 ENCOUNTER — Encounter (HOSPITAL_COMMUNITY): Payer: Self-pay | Admitting: Emergency Medicine

## 2020-09-06 ENCOUNTER — Emergency Department (HOSPITAL_COMMUNITY)
Admission: EM | Admit: 2020-09-06 | Discharge: 2020-09-06 | Disposition: A | Discharge status: Home / Self Care | Payer: Medicaid Other | Attending: Emergency Medicine | Admitting: Emergency Medicine

## 2020-09-06 ENCOUNTER — Other Ambulatory Visit: Payer: Self-pay

## 2020-09-06 DIAGNOSIS — R202 Paresthesia of skin: Secondary | ICD-10-CM | POA: Diagnosis not present

## 2020-09-06 DIAGNOSIS — M79601 Pain in right arm: Secondary | ICD-10-CM

## 2020-09-06 DIAGNOSIS — J45909 Unspecified asthma, uncomplicated: Secondary | ICD-10-CM | POA: Insufficient documentation

## 2020-09-06 MED ORDER — PREDNISONE 10 MG (21) PO TBPK: ORAL_TABLET | Freq: Every day | ORAL | 0 refills | Status: DC

## 2020-09-06 MED ORDER — PREDNISONE 20 MG PO TABS
60.0000 mg | ORAL_TABLET | Freq: Once | ORAL | Status: AC
  Administered 2020-09-06: 60 mg via ORAL
  Filled 2020-09-06: qty 3

## 2020-09-06 MED ORDER — ACETAMINOPHEN 325 MG PO TABS
650.0000 mg | ORAL_TABLET | Freq: Once | ORAL | Status: AC
  Administered 2020-09-06: 650 mg via ORAL
  Filled 2020-09-06: qty 2

## 2020-09-06 MED FILL — predniSONE 10 MG TABS: 10 | 12 days supply | Qty: 42 | Fill #0

## 2020-09-06 NOTE — ED Triage Notes (Signed)
Pt reports R arm pain with movement x2 days. Endorses some tingling to arm as well. Denies any trauma or injury.

## 2020-09-06 NOTE — ED Provider Notes (Signed)
[facility] EMERGENCY DEPARTMENT Provider Note   CSN: [id number] Arrival date & time: 09/06/20  1036     History Chief Complaint  Patient presents with  . Arm Pain    Larry Shaw is a 64 y.o. left-hand-dominant male past medical history significant for asthma, lumbar herniated disc.  Not anticoagulated.  HPI Patient presents to emergency department today with chief complaint of right arm pain x2 days.  He denies any known injury or fall.  He describes the pain as located in his right tricep and radiating down his arm to his middle finger.  He states he has some numbness to the middle finger as well.  He otherwise describes the pain as an aching sensation in his tricep.  He states the pain is intermittent however the numbness has been constant.  Did not take any medications for symptoms prior to arrival. He denies any headache, dizziness, visual changes, weakness, arthralgias.     Past Medical History:  Diagnosis Date  . Asthma     Patient Active Problem List   Diagnosis Date Noted  . Lumbar herniated disc 02/28/2016  . Back pain at L4-L5 level 01/13/2016    Past Surgical History:  Procedure Laterality Date  . LEG SURGERY         No family history on file.  Social History   Tobacco Use  . Smoking status: Never Smoker  . Smokeless tobacco: Never Used  Substance Use Topics  . Alcohol use: No  . Drug use: No    Home Medications Prior to Admission medications   Medication Sig Start Date End Date Taking? Authorizing Provider  acetaminophen-codeine (TYLENOL #3) 300-30 MG tablet Take 1 tablet by mouth every 4 (four) hours as needed for moderate pain. 03/21/16   Langeland, Kathaleen Grinder, MD  albuterol (PROVENTIL HFA;VENTOLIN HFA) 108 (90 BASE) MCG/ACT inhaler Inhale 2 puffs into the lungs every 6 (six) hours as needed for wheezing. 10/29/12   Johnson, Clanford L, MD  amoxicillin (AMOXIL) 500 MG capsule Take 1 capsule (500 mg total) by mouth 3 (three) times  daily. 01/31/17   Roxy Horseman, PA-C  cetirizine (ZYRTEC) 10 MG tablet Take 1 tablet (10 mg total) by mouth daily. Patient not taking: Reported on 05/01/2016 02/28/16   Dierdre Searles T, MD  cetirizine-pseudoephedrine (ZYRTEC-D) 5-120 MG tablet Take 1 tablet by mouth 2 (two) times daily. Patient not taking: Reported on 02/28/2016 11/10/15   [name], NP  ciprofloxacin-dexamethasone [facility]) otic suspension Place 4 drops into the right ear 2 (two) times daily. 01/31/17   Roxy Horseman, PA-C  clotrimazole (LOTRIMIN) 1 % cream Apply to penis area 2 times daily until redness goes away. 08/03/17   Kellie Shropshire, PA-C  cromolyn (NASALCROM) 5.2 MG/ACT nasal spray Place 1 spray into both nostrils 4 (four) times daily. Patient not taking: Reported on 05/01/2016 11/10/15   [name], NP  diclofenac sodium (VOLTAREN) 1 % GEL Apply 2 g topically 4 (four) times daily. 02/28/16   Langeland, Kathaleen Grinder, MD  fluticasone (FLONASE) 50 MCG/ACT nasal spray Place 2 sprays into both nostrils daily. Patient not taking: Reported on 05/01/2016 02/28/16   Pete Glatter, MD  gabapentin (NEURONTIN) 300 MG capsule Take 1 capsule (300 mg total) by mouth 3 (three) times daily. Patient not taking: Reported on 05/01/2016 01/10/16   Cheri Fowler, PA-C  hydrocortisone cream 1 % Apply to affected area 2 times daily 12/02/17   Hedges, Tinnie Gens, PA-C  hydrOXYzine (ATARAX/VISTARIL) 25 MG tablet Take  1 tablet (25 mg total) every 6 (six) hours by mouth. 08/03/17   Mady Gemma, Danella Penton, PA-C  loratadine (CLARITIN) 10 MG tablet Take 1 tablet (10 mg total) by mouth daily. 12/02/17   Hedges, Tinnie Gens, PA-C  methocarbamol (ROBAXIN-750) 750 MG tablet Take 1 tablet (750 mg total) by mouth every 6 (six) hours as needed for muscle spasms. 03/21/16   Pete Glatter, MD  oxyCODONE-acetaminophen (PERCOCET) 5-325 MG tablet Take 2 tablets by mouth every 4 (four) hours as needed. 05/12/20   Bethann Berkshire, MD  predniSONE (STERAPRED UNI-PAK 21 TAB) 10  MG (21) TBPK tablet Take by mouth daily. Take 6 tabs by mouth daily  for 2 days, then 5 tabs for 2 days, then 4 tabs for 2 days, then 3 tabs for 2 days, 2 tabs for 2 days, then 1 tab by mouth daily for 2 days 09/07/20   Namon Cirri E, PA-C  sodium chloride (OCEAN) 0.65 % SOLN nasal spray Place 1 spray into both nostrils as needed for congestion. 11/10/15   [name], NP  traMADol (ULTRAM) 50 MG tablet Take 1 tablet (50 mg total) by mouth every 6 (six) hours as needed. 05/01/16   Pete Glatter, MD    Allergies    Penicillins  Review of Systems   Review of Systems All other systems are reviewed and are negative for acute change except as noted in the HPI.  Physical Exam Updated Vital Signs BP 120/84 (BP Location: Left Arm)   Pulse 97   Temp 98 F (36.7 C) (Oral)   Resp 18   SpO2 99%   Physical Exam Vitals and nursing note reviewed.  Constitutional:      Appearance: He is well-developed. He is not ill-appearing or toxic-appearing.  HENT:     Head: Normocephalic and atraumatic.     Nose: Nose normal.  Eyes:     General: No scleral icterus.       Right eye: No discharge.        Left eye: No discharge.     Conjunctiva/sclera: Conjunctivae normal.  Neck:     Vascular: No JVD.     Comments: Full ROM intact without spinous process TTP. No bony stepoffs or deformities, no paraspinous muscle TTP or muscle spasms. No rigidity or meningeal signs. No bruising, erythema, or swelling.  Cardiovascular:     Rate and Rhythm: Normal rate and regular rhythm.     Pulses: Normal pulses.          Radial pulses are 2+ on the right side and 2+ on the left side.     Heart sounds: Normal heart sounds.  Pulmonary:     Effort: Pulmonary effort is normal.     Breath sounds: Normal breath sounds.  Abdominal:     General: There is no distension.  Musculoskeletal:        General: Normal range of motion.     Cervical back: Normal range of motion.     Comments: Full range of motion of all  extremities.  No tenderness to palpation of right upper extremity.  Sensation subjectively decreased through C6 dermatome of right upper extremity Abduction and adduction of the fingers intact against resistance. Grip strength equal bilaterally. Supination and pronation intact against resistance. Strength 5/5 through the cardinal directions of the bilateral wrists. Strength 5/5 with flexion and extension of the bilateral elbows. Patient can touch the thumb to each one of the fingertips without difficulty.    Right upper extremity is neurovascularly  intact.  Compartments are soft.  Skin:    General: Skin is warm and dry.  Neurological:     Mental Status: He is oriented to person, place, and time.     GCS: GCS eye subscore is 4. GCS verbal subscore is 5. GCS motor subscore is 6.     Comments: Speech is clear and goal oriented, follows commands CN III-XII intact, no facial droop Moves extremities without ataxia, coordination intact Normal finger to nose and rapid alternating movements Normal gait and balance   Psychiatric:        Behavior: Behavior normal.     ED Results / Procedures / Treatments   Labs (all labs ordered are listed, but only abnormal results are displayed) Labs Reviewed - No data to display  EKG None  Radiology No results found.  Procedures Procedures (including critical care time)  Medications Ordered in ED Medications  acetaminophen (TYLENOL) tablet 650 mg (650 mg Oral Given 09/06/20 1318)  predniSONE (DELTASONE) tablet 60 mg (60 mg Oral Given 09/06/20 1318)    ED Course  I have reviewed the triage vital signs and the nursing notes.  Pertinent labs & imaging results that were available during my care of the patient were reviewed by me and considered in my medical decision making (see chart for details).    MDM Rules/Calculators/A&P                          History provided by patient with additional history obtained from chart review.     64 year old male presenting with numbness and tingling in right upper extremity x2 days.  He is afebrile, hemodynamically stable.  He is very well-appearing.  On exam he has decreased sensation in the C6 dermatome.  Neuro exam is otherwise normal.  Right upper extremity is neurologically intact.  Patient given Tylenol and prednisone.  Presentation is suggestive of radicular pain, possible pinched nerve.  No indications for further emergent work-up.  Will treat with prednisone taper and recommend OTC Tylenol.  Patient knows to follow-up with Ortho or PCP if symptoms continue.  Return precautions discussed.  Patient is agreeable with plan of care.   Portions of this note were generated with Scientist, clinical (histocompatibility and immunogenetics). Dictation errors may occur despite best attempts at proofreading.  Final Clinical Impression(s) / ED Diagnoses Final diagnoses:  Right arm pain    Rx / DC Orders ED Discharge Orders         Ordered    predniSONE (STERAPRED UNI-PAK 21 TAB) 10 MG (21) TBPK tablet  Daily        09/06/20 1329           Kandice Hams 09/06/20 1330    Linwood Dibbles, MD 09/07/20 [phone]

## 2020-09-06 NOTE — Discharge Instructions (Signed)
-  Prescription sent to your pharmacy for prednisone taper.  You already had the first dose today so start taking it tomorrow.  This should help with your pain.  -Recommend you take Tylenol for pain at home.  -Follow-up with your primary care doctor as soon as possible for symptom recheck.  -If you continue to have pain after completing the steroids you should follow-up with orthopedics.  Dr. Aundria Rud is on-call.  Call his office.

## 2020-09-21 ENCOUNTER — Ambulatory Visit (HOSPITAL_COMMUNITY)
Admission: EM | Admit: 2020-09-21 | Discharge: 2020-09-21 | Disposition: A | Discharge status: Home / Self Care | Payer: Medicaid Other | Attending: Family Medicine | Admitting: Family Medicine

## 2020-09-21 ENCOUNTER — Encounter (HOSPITAL_COMMUNITY): Payer: Self-pay | Admitting: Emergency Medicine

## 2020-09-21 DIAGNOSIS — R0981 Nasal congestion: Secondary | ICD-10-CM | POA: Diagnosis present

## 2020-09-21 DIAGNOSIS — J069 Acute upper respiratory infection, unspecified: Secondary | ICD-10-CM | POA: Diagnosis not present

## 2020-09-21 DIAGNOSIS — Z20822 Contact with and (suspected) exposure to covid-19: Secondary | ICD-10-CM | POA: Diagnosis not present

## 2020-09-21 MED ORDER — FLUTICASONE PROPIONATE 50 MCG/ACT NA SUSP: 2.0000 | Freq: Every day | NASAL | 2 refills | Status: AC

## 2020-09-21 MED ORDER — CETIRIZINE-PSEUDOEPHEDRINE ER 5-120 MG PO TB12: 1.0000 | ORAL_TABLET | Freq: Two times a day (BID) | ORAL | 0 refills | Status: AC

## 2020-09-21 NOTE — ED Provider Notes (Signed)
  [facility]   [id number] 09/21/20 Arrival Time: 1523  ASSESSMENT & PLAN:  1. Viral upper respiratory tract infection   2. Nasal congestion     COVID-19 testing sent. See letter/work note on file for self-isolation guidelines. OTC symptom care as needed.  Begin trial of: Meds ordered this encounter  Medications  . cetirizine-pseudoephedrine (ZYRTEC-D) 5-120 MG tablet    Sig: Take 1 tablet by mouth 2 (two) times daily.    Dispense:  20 tablet    Refill:  0  . fluticasone (FLONASE) 50 MCG/ACT nasal spray    Sig: Place 2 sprays into both nostrils daily.    Dispense:  16 g    Refill:  2     Follow-up Information    Practice, [facility].   Why: As needed. Contact information: [address] [place] [address] [phone]               Reviewed expectations re: course of current medical issues. Questions answered. Outlined signs and symptoms indicating need for more acute intervention. Understanding verbalized. After Visit Summary given.   SUBJECTIVE: History from: patient. Larry Shaw is a 65 y.o. male who presents with worries regarding COVID-19. Known COVID-19 contact: none. Recent travel: none. Reports: nasal congestion. Denies: fever and difficulty breathing. Normal PO intake without n/v/d.    OBJECTIVE:  Vitals:   09/21/20 1716  BP: 130/82  Pulse: 79  Resp: 17  Temp: 98.4 F (36.9 C)  TempSrc: Temporal  SpO2: 99%    General appearance: alert; no distress Eyes: PERRLA; EOMI; conjunctiva normal HENT: [place]; AT; with nasal congestion Neck: supple  Lungs: speaks full sentences without difficulty; unlabored Extremities: no edema Skin: warm and dry Neurologic: normal gait Psychological: alert and cooperative; normal mood and affect  Labs:  Labs Reviewed  SARS CORONAVIRUS 2 (TAT 6-24 HRS)      Allergies  Allergen Reactions  . Penicillins     Past Medical History:  Diagnosis Date  . Asthma    Social  History   Socioeconomic History  . Marital status: Single    Spouse name: Not on file  . Number of children: Not on file  . Years of education: Not on file  . Highest education level: Not on file  Occupational History  . Not on file  Tobacco Use  . Smoking status: Never Smoker  . Smokeless tobacco: Never Used  Substance and Sexual Activity  . Alcohol use: No  . Drug use: No  . Sexual activity: Not on file  Other Topics Concern  . Not on file  Social History Narrative  . Not on file   Social Determinants of Health   Financial Resource Strain: Not on file  Food Insecurity: Not on file  Transportation Needs: Not on file  Physical Activity: Not on file  Stress: Not on file  Social Connections: Not on file  Intimate Partner Violence: Not on file   Family History  Problem Relation Age of Onset  . Stroke Father    Past Surgical History:  Procedure Laterality Date  . BACK SURGERY    . LEG SURGERY       Mardella Layman, MD 09/22/20 [phone]

## 2020-09-21 NOTE — Discharge Instructions (Signed)
You have been tested for COVID-19 today. °If your test returns positive, you will receive a phone call from [facility] regarding your results. °Negative test results are not called. °Both positive and negative results area always visible on MyChart. °If you do not have a MyChart account, sign up instructions are provided in your discharge papers. °Please do not hesitate to contact us should you have questions or concerns. ° °

## 2020-09-21 NOTE — ED Triage Notes (Signed)
Pt states that he has nasal congestion that started a couple days ago. Pt states that he is congested in his head

## 2020-09-22 LAB — SARS CORONAVIRUS 2 (TAT 6-24 HRS): SARS Coronavirus 2: NEGATIVE

## 2020-10-11 ENCOUNTER — Other Ambulatory Visit: Payer: Self-pay

## 2020-10-11 ENCOUNTER — Ambulatory Visit (HOSPITAL_COMMUNITY)
Admission: EM | Admit: 2020-10-11 | Discharge: 2020-10-11 | Disposition: A | Discharge status: Home / Self Care | Payer: Medicaid Other | Attending: Student | Admitting: Student

## 2020-10-11 ENCOUNTER — Encounter (HOSPITAL_COMMUNITY): Payer: Self-pay

## 2020-10-11 DIAGNOSIS — J069 Acute upper respiratory infection, unspecified: Secondary | ICD-10-CM | POA: Diagnosis present

## 2020-10-11 DIAGNOSIS — J302 Other seasonal allergic rhinitis: Secondary | ICD-10-CM | POA: Insufficient documentation

## 2020-10-11 DIAGNOSIS — Z1152 Encounter for screening for COVID-19: Secondary | ICD-10-CM

## 2020-10-11 DIAGNOSIS — J209 Acute bronchitis, unspecified: Secondary | ICD-10-CM | POA: Insufficient documentation

## 2020-10-11 DIAGNOSIS — Z8709 Personal history of other diseases of the respiratory system: Secondary | ICD-10-CM | POA: Insufficient documentation

## 2020-10-11 LAB — COMPREHENSIVE METABOLIC PANEL
ALT: 24 U/L (ref 0–44)
AST: 20 U/L (ref 15–41)
Albumin: 4 g/dL (ref 3.5–5.0)
Alkaline Phosphatase: 77 U/L (ref 38–126)
Anion gap: 8 (ref 5–15)
BUN: 13 mg/dL (ref 8–23)
CO2: 27 mmol/L (ref 22–32)
Calcium: 9.7 mg/dL (ref 8.9–10.3)
Chloride: 103 mmol/L (ref 98–111)
Creatinine, Ser: 1.11 mg/dL (ref 0.61–1.24)
GFR, Estimated: >60 mL/min (ref >60)
Glucose, Bld: 99 mg/dL (ref 70–99)
Potassium: 3.9 mmol/L (ref 3.5–5.1)
Sodium: 138 mmol/L (ref 135–145)
Total Bilirubin: 0.9 mg/dL (ref 0.3–1.2)
Total Protein: 7.1 g/dL (ref 6.5–8.1)

## 2020-10-11 LAB — SARS CORONAVIRUS 2 (TAT 6-24 HRS): SARS Coronavirus 2: NEGATIVE

## 2020-10-11 MED ORDER — ALBUTEROL SULFATE HFA 108 (90 BASE) MCG/ACT IN AERS: 1.0000 | INHALATION_SPRAY | Freq: Four times a day (QID) | RESPIRATORY_TRACT | 0 refills | Status: AC | PRN

## 2020-10-11 MED ORDER — PREDNISONE 10 MG (21) PO TBPK: ORAL_TABLET | Freq: Every day | ORAL | 0 refills | Status: AC

## 2020-10-11 MED ORDER — CETIRIZINE HCL 10 MG PO TABS: 10.0000 mg | ORAL_TABLET | Freq: Every day | ORAL | 1 refills | Status: AC

## 2020-10-11 NOTE — Discharge Instructions (Addendum)
-  For bronchitis: prednisone taper, albuterol inhaler as needed -For seasonal allergies: zyrtec, one pill daily. Also continue flonase.  -For fevers/chills, body aches, headaches- use Tylenol and Ibuprofen. You can alternate these for maximum effect. Use up to 3000mg  Tylenol daily and 3200mg  Ibuprofen daily. Make sure to take ibuprofen with food. Check the bottle of ibuprofen/tylenol for specific dosage instructions.  We are currently awaiting result of your PCR covid-19 test. This typically comes back in 1-2 days. We'll call you if the result is positive. Otherwise, the result will be sent electronically to your MyChart. You can also call this clinic and ask for your result via telephone.   Please isolate at home while awaiting these results. If your test is positive for Covid-19, continue to isolate at home for 5 days if you have mild symptoms, or a total of 10 days from symptom onset if you have more severe symptoms. If you quarantine for a shorter period of time (i.e. 5 days), make sure to wear a mask until day 10 of symptoms. Treat your symptoms at home with OTC remedies like tylenol/ibuprofen, mucinex, nyquil, etc. Seek medical attention if you develop high fevers, chest pain, shortness of breath, ear pain, facial pain, etc. Make sure to get up and move around every 2-3 hours while convalescing to help prevent blood clots. Drink plenty of fluids, and rest as much as possible.

## 2020-10-11 NOTE — ED Triage Notes (Signed)
Patient states he has had a cough and sore throat since his last visit to the ED on 05 Jan 22. Pt still complains of congestion as well. Pt is aox4 and ambulatory.

## 2020-10-11 NOTE — ED Provider Notes (Signed)
[facility]    CSN: [id number] Arrival date & time: 10/11/20  1009      History   Chief Complaint Chief Complaint  Patient presents with  . Cough    X 3 days  . Sore Throat    X 3 days    HPI Larry Shaw is a 65 y.o. male Presenting for URI symptoms for 3 days. History of asthma previously on albuterol inhaler but not currently. Pt states he initially developed URI symptoms 3 weeks ago. These improved on their own, though the cough lingered. However, 3 days ago, the cough started getting worse again, with some postnasal drip and sore throat. States cough is productive and keeps him up at night. Denies fevers/chills, n/v/d, shortness of breath, chest pain, facial pain, teeth pain, headaches,loss of taste/smell, swollen lymph nodes, ear pain. Denies chest pain, shortness of breath, confusion, high fevers.    HPI  Past Medical History:  Diagnosis Date  . Asthma     Patient Active Problem List   Diagnosis Date Noted  . Lumbar herniated disc 02/28/2016  . Back pain at L4-L5 level 01/13/2016    Past Surgical History:  Procedure Laterality Date  . BACK SURGERY    . LEG SURGERY         Home Medications    Prior to Admission medications   Medication Sig Start Date End Date Taking? Authorizing Provider  acetaminophen-codeine (TYLENOL #3) 300-30 MG tablet Take 1 tablet by mouth every 4 (four) hours as needed for moderate pain. 03/21/16  Yes Langeland, Dawn T, MD  albuterol (VENTOLIN HFA) 108 (90 Base) MCG/ACT inhaler Inhale 1-2 puffs into the lungs every 6 (six) hours as needed for wheezing or shortness of breath. 10/11/20  Yes Rhys Martini, PA-C  cetirizine (ZYRTEC ALLERGY) 10 MG tablet Take 1 tablet (10 mg total) by mouth daily. 10/11/20  Yes Rhys Martini, PA-C  cetirizine-pseudoephedrine (ZYRTEC-D) 5-120 MG tablet Take 1 tablet by mouth 2 (two) times daily. 09/21/20  Yes Hagler, Arlys John, MD  cromolyn (NASALCROM) 5.2 MG/ACT nasal spray Place 1 spray into both  nostrils 4 (four) times daily. 11/10/15  Yes [name], NP  fluticasone [facility]) 50 MCG/ACT nasal spray Place 2 sprays into both nostrils daily. 09/21/20  Yes Hagler, Arlys John, MD  oxyCODONE-acetaminophen (PERCOCET) 5-325 MG tablet Take 2 tablets by mouth every 4 (four) hours as needed. 05/12/20  Yes Bethann Berkshire, MD  predniSONE (STERAPRED UNI-PAK 21 TAB) 10 MG (21) TBPK tablet Take by mouth daily. Take 6 tabs by mouth daily  for 2 days, then 5 tabs for 2 days, then 4 tabs for 2 days, then 3 tabs for 2 days, 2 tabs for 2 days, then 1 tab by mouth daily for 2 days 10/11/20  Yes Cheree Ditto, Lyman Speller, PA-C  traMADol (ULTRAM) 50 MG tablet Take 1 tablet (50 mg total) by mouth every 6 (six) hours as needed. 05/01/16  Yes Langeland, Dawn T, MD  gabapentin (NEURONTIN) 300 MG capsule Take 1 capsule (300 mg total) by mouth 3 (three) times daily. Patient not taking: No sig reported 01/10/16 10/11/20  Cheri Fowler, PA-C  sodium chloride (OCEAN) 0.65 % SOLN nasal spray Place 1 spray into both nostrils as needed for congestion. 11/10/15 10/11/20  [name], NP    Family History Family History  Problem Relation Age of Onset  . Stroke Father     Social History Social History   Tobacco Use  . Smoking status: Never Smoker  . Smokeless  tobacco: Never Used  Vaping Use  . Vaping Use: Never used  Substance Use Topics  . Alcohol use: No  . Drug use: No     Allergies   Penicillins   Review of Systems Review of Systems  Constitutional: Negative for appetite change, chills and fever.  HENT: Positive for congestion. Negative for ear pain, rhinorrhea, sinus pressure, sinus pain and sore throat.   Eyes: Negative for redness and visual disturbance.  Respiratory: Positive for cough. Negative for chest tightness, shortness of breath and wheezing.   Cardiovascular: Negative for chest pain and palpitations.  Gastrointestinal: Negative for abdominal pain, constipation, diarrhea, nausea and vomiting.  Genitourinary:  Negative for dysuria, frequency and urgency.  Musculoskeletal: Negative for myalgias.  Neurological: Negative for dizziness, weakness and headaches.  Psychiatric/Behavioral: Negative for confusion.  All other systems reviewed and are negative.    Physical Exam Triage Vital Signs ED Triage Vitals  Enc Vitals Group     BP 10/11/20 1025 138/80     Pulse Rate 10/11/20 1025 70     Resp 10/11/20 1025 18     Temp 10/11/20 1025 98.3 F (36.8 C)     Temp Source 10/11/20 1025 Oral     SpO2 10/11/20 1025 99 %     Weight --      Height --      Head Circumference --      Peak Flow --      Pain Score 10/11/20 1027 2     Pain Loc --      Pain Edu? --      Excl. in GC? --    No data found.  Updated Vital Signs BP 138/80 (BP Location: Left Arm)   Pulse 70   Temp 98.3 F (36.8 C) (Oral)   Resp 18   SpO2 99%   Visual Acuity Right Eye Distance:   Left Eye Distance:   Bilateral Distance:    Right Eye Near:   Left Eye Near:    Bilateral Near:     Physical Exam Vitals reviewed.  Constitutional:      General: He is not in acute distress.    Appearance: Normal appearance. He is not ill-appearing.  HENT:     Head: Normocephalic and atraumatic.     Comments: Smooth velvety erythema posterior pharynx    Right Ear: Hearing, tympanic membrane, ear canal and external ear normal. No swelling or tenderness. There is no impacted cerumen. No mastoid tenderness. Tympanic membrane is not perforated, erythematous, retracted or bulging.     Left Ear: Hearing, tympanic membrane, ear canal and external ear normal. No swelling or tenderness. There is no impacted cerumen. No mastoid tenderness. Tympanic membrane is not perforated, erythematous, retracted or bulging.     Nose:     Right Sinus: No maxillary sinus tenderness or frontal sinus tenderness.     Left Sinus: No maxillary sinus tenderness or frontal sinus tenderness.     Mouth/Throat:     Mouth: Mucous membranes are moist.     Pharynx:  Uvula midline. No oropharyngeal exudate or posterior oropharyngeal erythema.     Tonsils: No tonsillar exudate.  Cardiovascular:     Rate and Rhythm: Normal rate and regular rhythm.     Heart sounds: Normal heart sounds.  Pulmonary:     Breath sounds: Normal breath sounds and air entry. No wheezing, rhonchi or rales.  Chest:     Chest wall: No tenderness.  Abdominal:     General: Abdomen is  flat. Bowel sounds are normal.     Tenderness: There is no abdominal tenderness. There is no guarding or rebound.  Lymphadenopathy:     Cervical: No cervical adenopathy.  Neurological:     General: No focal deficit present.     Mental Status: He is alert and oriented to person, place, and time.  Psychiatric:        Attention and Perception: Attention and perception normal.        Mood and Affect: Mood and affect normal.        Behavior: Behavior normal. Behavior is cooperative.        Thought Content: Thought content normal.        Judgment: Judgment normal.      UC Treatments / Results  Labs (all labs ordered are listed, but only abnormal results are displayed) Labs Reviewed  SARS CORONAVIRUS 2 (TAT 6-24 HRS)  COMPREHENSIVE METABOLIC PANEL    EKG   Radiology No results found.  Procedures Procedures (including critical care time)  Medications Ordered in UC Medications - No data to display  Initial Impression / Assessment and Plan / UC Course  I have reviewed the triage vital signs and the nursing notes.  Pertinent labs & imaging results that were available during my care of the patient were reviewed by me and considered in my medical decision making (see chart for details).     Covid test sent today.  Isolation precautions per CDC guidelines until negative result. Symptomatic relief with OTC Mucinex, Nyquil, etc. Return precautions- new/worsening fevers/chills, shortness of breath, chest pain, abd pain, etc.   -For bronchitis: prednisone taper, albuterol inhaler as needed.  He is not a diabetic. CMP sent today given pt has not had labwork checked in years. -For seasonal allergies: zyrtec, one pill daily. Also continue flonase.   -For fevers/chills, body aches, headaches- use Tylenol and Ibuprofen. You can alternate these for maximum effect. Use up to 3000mg  Tylenol daily and 3200mg  Ibuprofen daily. Make sure to take ibuprofen with food. Check the bottle of ibuprofen/tylenol for specific dosage instructions.  Final Clinical Impressions(s) / UC Diagnoses   Final diagnoses:  Viral upper respiratory tract infection  Acute bronchitis, unspecified organism  Encounter for screening for COVID-19  Seasonal allergies     Discharge Instructions     -For bronchitis: prednisone taper, albuterol inhaler as needed -For seasonal allergies: zyrtec, one pill daily. Also continue flonase.  -For fevers/chills, body aches, headaches- use Tylenol and Ibuprofen. You can alternate these for maximum effect. Use up to 3000mg  Tylenol daily and 3200mg  Ibuprofen daily. Make sure to take ibuprofen with food. Check the bottle of ibuprofen/tylenol for specific dosage instructions.  We are currently awaiting result of your PCR covid-19 test. This typically comes back in 1-2 days. We'll call you if the result is positive. Otherwise, the result will be sent electronically to your MyChart. You can also call this clinic and ask for your result via telephone.   Please isolate at home while awaiting these results. If your test is positive for Covid-19, continue to isolate at home for 5 days if you have mild symptoms, or a total of 10 days from symptom onset if you have more severe symptoms. If you quarantine for a shorter period of time (i.e. 5 days), make sure to wear a mask until day 10 of symptoms. Treat your symptoms at home with OTC remedies like tylenol/ibuprofen, mucinex, nyquil, etc. Seek medical attention if you develop high fevers, chest pain, shortness of  breath, ear pain, facial pain, etc.  Make sure to get up and move around every 2-3 hours while convalescing to help prevent blood clots. Drink plenty of fluids, and rest as much as possible.    ED Prescriptions    Medication Sig Dispense Auth. Provider   predniSONE (STERAPRED UNI-PAK 21 TAB) 10 MG (21) TBPK tablet Take by mouth daily. Take 6 tabs by mouth daily  for 2 days, then 5 tabs for 2 days, then 4 tabs for 2 days, then 3 tabs for 2 days, 2 tabs for 2 days, then 1 tab by mouth daily for 2 days 42 tablet Rhys MartiniGraham, Dabney Dever E, PA-C   albuterol (VENTOLIN HFA) 108 (90 Base) MCG/ACT inhaler Inhale 1-2 puffs into the lungs every 6 (six) hours as needed for wheezing or shortness of breath. 1 each Rhys MartiniGraham, Charleton Deyoung E, PA-C   cetirizine (ZYRTEC ALLERGY) 10 MG tablet Take 1 tablet (10 mg total) by mouth daily. 30 tablet Rhys MartiniGraham, Nelvin Tomb E, PA-C     PDMP not reviewed this encounter.   Rhys MartiniGraham, Dima Mini E, PA-C 10/11/20 1152

## 2020-10-11 NOTE — ED Notes (Signed)
Pt requests that his son be contacted for any lab results or further need 2/2 pt phone not in service. Son's name is [employer] [phone]

## 2020-11-15 DEATH — deceased
